# Patient Record
Sex: Female | Born: 1975 | Race: Black or African American | Hispanic: Yes | Marital: Single | Smoking: Never smoker
Health system: Southern US, Community
[De-identification: ages and names within clinical notes are randomized; demographics above are authoritative.]

## PROBLEM LIST (undated history)

## (undated) DIAGNOSIS — F419 Anxiety disorder, unspecified: Secondary | ICD-10-CM

## (undated) DIAGNOSIS — F410 Panic disorder [episodic paroxysmal anxiety] without agoraphobia: Secondary | ICD-10-CM

---

## 2000-06-20 ENCOUNTER — Inpatient Hospital Stay (HOSPITAL_COMMUNITY): Admission: AD | Admit: 2000-06-20 | Discharge: 2000-06-22 | Payer: Self-pay | Admitting: Obstetrics and Gynecology

## 2000-08-01 ENCOUNTER — Other Ambulatory Visit: Admission: RE | Admit: 2000-08-01 | Discharge: 2000-08-01 | Payer: Self-pay | Admitting: Obstetrics and Gynecology

## 2001-07-12 ENCOUNTER — Other Ambulatory Visit: Admission: RE | Admit: 2001-07-12 | Discharge: 2001-07-12 | Payer: Self-pay | Admitting: Obstetrics and Gynecology

## 2001-07-15 ENCOUNTER — Encounter: Payer: Self-pay | Admitting: Obstetrics and Gynecology

## 2001-07-15 ENCOUNTER — Encounter: Admission: RE | Admit: 2001-07-15 | Discharge: 2001-07-15 | Payer: Self-pay | Admitting: Obstetrics and Gynecology

## 2001-09-16 ENCOUNTER — Emergency Department (HOSPITAL_COMMUNITY): Admission: EM | Admit: 2001-09-16 | Discharge: 2001-09-16 | Payer: Self-pay | Admitting: Emergency Medicine

## 2002-04-15 ENCOUNTER — Encounter: Payer: Self-pay | Admitting: Obstetrics and Gynecology

## 2002-04-15 ENCOUNTER — Encounter: Admission: RE | Admit: 2002-04-15 | Discharge: 2002-04-15 | Payer: Self-pay | Admitting: Obstetrics and Gynecology

## 2002-04-22 ENCOUNTER — Other Ambulatory Visit: Admission: RE | Admit: 2002-04-22 | Discharge: 2002-04-22 | Payer: Self-pay | Admitting: Obstetrics and Gynecology

## 2002-10-02 HISTORY — PX: CERVICAL CONE BIOPSY: SUR198

## 2003-04-07 ENCOUNTER — Other Ambulatory Visit: Admission: RE | Admit: 2003-04-07 | Discharge: 2003-04-07 | Payer: Self-pay | Admitting: Obstetrics and Gynecology

## 2003-04-29 ENCOUNTER — Encounter (INDEPENDENT_AMBULATORY_CARE_PROVIDER_SITE_OTHER): Payer: Self-pay | Admitting: Specialist

## 2003-04-29 ENCOUNTER — Ambulatory Visit (HOSPITAL_COMMUNITY): Admission: RE | Admit: 2003-04-29 | Discharge: 2003-04-29 | Payer: Self-pay | Admitting: Obstetrics and Gynecology

## 2003-11-05 ENCOUNTER — Other Ambulatory Visit: Admission: RE | Admit: 2003-11-05 | Discharge: 2003-11-05 | Payer: Self-pay | Admitting: Obstetrics and Gynecology

## 2004-04-16 ENCOUNTER — Inpatient Hospital Stay (HOSPITAL_COMMUNITY): Admission: AD | Admit: 2004-04-16 | Discharge: 2004-04-18 | Payer: Self-pay | Admitting: Obstetrics and Gynecology

## 2004-05-27 ENCOUNTER — Other Ambulatory Visit: Admission: RE | Admit: 2004-05-27 | Discharge: 2004-05-27 | Payer: Self-pay | Admitting: Obstetrics and Gynecology

## 2007-02-07 ENCOUNTER — Inpatient Hospital Stay (HOSPITAL_COMMUNITY): Admission: RE | Admit: 2007-02-07 | Discharge: 2007-02-10 | Payer: Self-pay | Admitting: Obstetrics and Gynecology

## 2007-02-07 ENCOUNTER — Encounter (INDEPENDENT_AMBULATORY_CARE_PROVIDER_SITE_OTHER): Payer: Self-pay | Admitting: Specialist

## 2007-10-26 ENCOUNTER — Emergency Department (HOSPITAL_COMMUNITY): Admission: EM | Admit: 2007-10-26 | Discharge: 2007-10-26 | Payer: Self-pay | Admitting: Family Medicine

## 2008-11-04 ENCOUNTER — Inpatient Hospital Stay (HOSPITAL_COMMUNITY): Admission: AD | Admit: 2008-11-04 | Discharge: 2008-11-06 | Payer: Self-pay | Admitting: Obstetrics and Gynecology

## 2009-02-13 ENCOUNTER — Emergency Department (HOSPITAL_COMMUNITY): Admission: EM | Admit: 2009-02-13 | Discharge: 2009-02-13 | Payer: Self-pay | Admitting: Family Medicine

## 2009-12-14 ENCOUNTER — Emergency Department (HOSPITAL_COMMUNITY)
Admission: EM | Admit: 2009-12-14 | Discharge: 2009-12-14 | Payer: Self-pay | Source: Home / Self Care | Admitting: Family Medicine

## 2010-01-28 ENCOUNTER — Encounter: Admission: RE | Admit: 2010-01-28 | Discharge: 2010-01-28 | Payer: Self-pay | Admitting: Family Medicine

## 2011-01-10 LAB — POCT URINALYSIS DIP (DEVICE)
Bilirubin Urine: NEGATIVE
Glucose, UA: NEGATIVE mg/dL
Nitrite: NEGATIVE
Protein, ur: 30 mg/dL — AB
Specific Gravity, Urine: 1.025 (ref 1.005–1.030)
Urobilinogen, UA: 0.2 mg/dL (ref 0.0–1.0)
pH: 7 (ref 5.0–8.0)

## 2011-01-10 LAB — POCT PREGNANCY, URINE: Preg Test, Ur: NEGATIVE

## 2011-01-17 LAB — CBC
HCT: 30.4 % — ABNORMAL LOW (ref 36.0–46.0)
Hemoglobin: 7.3 g/dL — CL (ref 12.0–15.0)
Hemoglobin: 8.5 g/dL — ABNORMAL LOW (ref 12.0–15.0)
MCHC: 31.6 g/dL (ref 30.0–36.0)
MCHC: 31.7 g/dL (ref 30.0–36.0)
MCHC: 32.3 g/dL (ref 30.0–36.0)
MCV: 79.2 fL (ref 78.0–100.0)
Platelets: 200 10*3/uL (ref 150–400)
Platelets: 228 10*3/uL (ref 150–400)
Platelets: 253 10*3/uL (ref 150–400)
RBC: 2.85 MIL/uL — ABNORMAL LOW (ref 3.87–5.11)
RBC: 4.73 MIL/uL (ref 3.87–5.11)
RDW: 15.8 % — ABNORMAL HIGH (ref 11.5–15.5)
RDW: 15.9 % — ABNORMAL HIGH (ref 11.5–15.5)
WBC: 11.8 10*3/uL — ABNORMAL HIGH (ref 4.0–10.5)
WBC: 16 10*3/uL — ABNORMAL HIGH (ref 4.0–10.5)
WBC: 8.9 10*3/uL (ref 4.0–10.5)

## 2011-01-17 LAB — RPR: RPR Ser Ql: NONREACTIVE

## 2011-01-17 LAB — TYPE AND SCREEN: ABO/RH(D): A POS

## 2011-01-17 LAB — ABO/RH: ABO/RH(D): A POS

## 2011-02-14 NOTE — Discharge Summary (Signed)
NAMEREMMY, CRASS NO.:  [id number]   MEDICAL RECORD NO.:  [id number]          PATIENT TYPE:  INP   LOCATION:  NA                            [facility]  [facility]   PHYSICIAN:  Malachi Pro. Ambrose Mantle, M.D. DATE OF BIRTH:  05/15/1976   DATE OF ADMISSION:  11/04/2008  DATE OF DISCHARGE:                               DISCHARGE SUMMARY   A 35 year old black married female para 3-0-3 gravida 4, EDC, November 04, 2008, admitted with contractions.  The patient has had a previous C-  section.  She was actually scheduled for repeat cesarean on November 05, 2008, but began contracting at home.  Blood group and type was A+,  negative antibody, sickle cell negative, RPR nonreactive, rubella  immune, hepatitis B surface antigen negative, HIV negative, GC and  Chlamydia negative.  TSH 04564, free T4 1.04.  Declined first and second  trimester screens, 1-hour Glucola 138.  Group B strep positive.  Vaginal  ultrasound on April 16, 2008, crown-rump length 4.24 cm, 11 weeks 1 day,  [facility] November 04, 2008.  Ultrasound on June 05, 2008, average  gestational age [redacted] weeks 4 days, [facility] November 02, 2008.  Prenatal care  was uncomplicated.  The patient planned the cesarean as stated on  November 05, 2008 if no labor.  She began contractions and came to the  maternity admission unit for evaluation.  Contractions were every 4-6  minutes.   PAST MEDICAL HISTORY:  Allergies: TO PERCOCET CAUSED ITCHING AND RASH.  Illnesses:  Depression.  Operation:  C-section _Conization of the cervix  _ alcohol, tobacco and drugs none.   FAMILY HISTORY:  Brother with high blood pressure, father depression.  Maternal grandmother with high blood pressure and a CVA.   OBSTETRICAL HISTORY:  September 2001, a 7 pounds 13 ounces female  vaginally at 40 weeks, July 2005, a 7 pounds 5 ounces female at 63 weeks  vaginally, May 2008, an 8 pounds 4 ounces female 39+ weeks C-section for  breech presentation.   PHYSICAL EXAM:   VITAL SIGNS:  Normal.  HEART/LUNGS:  Normal.  ABDOMEN:  Soft.  Fundal height had been 35 cm on November 03, 2008.  Fetal heart tones were normal.  Cervix was 3 cm, 100% vertex at -1.  Artificial rupture of membranes was attempted.  No fluid was seen.  The  patient was started on IV ampicillin to get the ampicillin in quicker in  case her labor was fast.  By 6:50 a.m., the patient had received an  epidural.  Contractions were far apart, so Pitocin had been begun at  6:15 a.m.  At 6:50 a.m., the contractions were every 4 minutes.  Cervix  was 5 cm, 100% vertex at -1 to 0 station.  She progressed to full  dilatation, pushed well and delivered a 7-pound 11-ounce female infant ROA  over a second-degree midline laceration.  Apgars were 9 at one and 9 at  five minutes.  Placenta was intact.  Uterus and prior C-section scars  were intact.  Second-degree midline laceration repaired with 2-0 Vicryl.  Blood loss about  400 mL.  I was called somewhere around 12:30 to 12:40  p.m. because of heavy bleeding.  The patient had been brought to her  room at approximately 10:30 a.m. without IV fluids.  She dozed off and  when she awoke, she had a gush of blood and cramps.  The bleeding and  cramps worsened and I was called at somewhere around 12:30 to 12:40 p.m.  When I arrived, the patient was crying in pain with pain in her right  lower quadrant and right low back.  The bladder was catheterized and I  removed 450 mL of clot and blood from the uterus.  Hemabate and Pitocin  had been started.  Blood pressure was 110/70, pulse was 91.  I massaged  the uterus vigorously and the patient's pain in the right lower  quadrant, right back eased.  Another exam of the uterus produced 2 more  clots that made the total 500 mL, the last 2 clots were felt to be  residual clots rather than active bleeding.  Blood pressure remained  steady at 104/69 and pulse was 90.  The patient looked pink.  We  continued the IV Pitocin and  massaged the uterus and followed her blood  count.  She never became hypotensive.  She ambulated well without  difficulty and on the second postpartum day, she was afebrile.  Her  blood pressure was normal and she was ready for discharge.  She did  undergo a type and screen which showed A+ with a negative antibody, RPR  was nonreactive.  Initial hemoglobin was 11/07, hematocrit 36.9, white  count 8900, platelet count 253,000.  Followup hemoglobins were 9.6, at  the time of the bleeding, 8.5 and 7.3.   FINAL DIAGNOSIS:  Intrauterine pregnancy at 40 weeks, delivered ROA,  prior cesarean section.  Postpartum hemorrhage from uterine atony.   OPERATION:  Spontaneous delivery right occiput anterior, evacuation of  blood clots from the uterus, repair of second-degree midline laceration.   FINAL CONDITION:  Improved.   INSTRUCTIONS:  Our regular discharge instruction booklet.  The patient  is given Darvocet-N 100, 30 tablets 1 every 4-6 hours as needed for pain  and Motrin 600 mg 30 tablets 1 every 6 hours as needed for pain.  She is  advised to take ferrous sulfate 325 mg twice daily and continue her  prenatal vitamins.  She is to return to the office in 6 weeks for  followup examination, report any problems.      Malachi Pro. Ambrose Mantle, M.D.  Electronically Signed     TFH/MEDQ  D:  11/06/2008  T:  11/06/2008  Job:  [id number]

## 2011-02-17 NOTE — Op Note (Signed)
NAMECHRIS, NARASIMHAN NO.:  [id number]   MEDICAL RECORD NO.:  [id number]          PATIENT TYPE:  INP   LOCATION:  9110                          FACILITY:  [facility]   PHYSICIAN:  Malachi Pro. Ambrose Mantle, M.D. DATE OF BIRTH:  09/29/1976   DATE OF PROCEDURE:  02/07/2007  DATE OF DISCHARGE:                               OPERATIVE REPORT   PREOPERATIVE DIAGNOSES:  Intrauterine pregnancy, 39 plus weeks, breech  presentation.   POSTOPERATIVE DIAGNOSES:  Intrauterine pregnancy, 39 plus weeks, breech  presentation.   OPERATION:  Low-transverse cervical C-section.   SURGEON:  Malachi Pro. Ambrose Mantle, M.D.   ASSISTANT:  Sherron Monday, MD   ANESTHESIA:  Spinal anesthesia by Dr. Tacy Dura.   DESCRIPTION OF PROCEDURE:  The patient was brought to the operating room  after breech presentation had been confirmed in the holding area.  She  was given a spinal anesthetic by Dr. Tacy Dura, placed in the left lateral  tilt position.  The abdomen was prepped with Betadine solution.  The  urethra was prepped and a Foley catheter was inserted to straight drain.  The patient was placed flat with tilt to the left.  The abdomen was  draped as a sterile field.  Anesthesia was confirmed by pinching with an  Allis clamp on the lower abdomen.  A transverse incision was made and  carried in layers through the skin and subcutaneous tissue and fascia.  The fascia was separated from the rectus muscles superiorly and  inferiorly.  The rectus muscle was then split in the midline.  The  peritoneum was opened vertically.  The lower uterine segment was  exposed.  Incision was made in the lower uterine segment myometrium.  With my finger I went the rest of the way into the myometrium into the  amniotic sac.  I got clear fluid.  I pulled superiorly and inferiorly on  the incision and then identified both feet.  I pulled both feet out  through the incisional opening and then with a combination of slight  pressure on the  legs and fundal pressure I was able to deliver the baby  up to the buttocks, up through the thorax.  The left arm was delivered  easily as was the right arm.  The face presented.  The baby did not pop  out with fundal pressure so I used the Mauriceau-Smellie-Veit maneuver  and the baby still did not come out so I placed my fingers under the  baby's head and was a able to deliver the head without difficulty.  Cord  was clamped.  The infant was given Dr. Rennis Golden who was in  attendance.  Dr. Alison Murray assigned the 8 pound 4 ounces female infant,  Apgars of 9 at 1 and 9 at 5 minutes.  A small loop of cord was preserved  in case a pH was necessary.  The rest of the placenta and cord were  preserved for the cord blood people.  I inspected the inside of the  uterus, found there to be no placental fragments left.  This cervix was  already a fingertip dilated  and that was confirmed on the exam on  02/06/2007, so no dilatation of the cervix was done.  The uterine  incision was then closed in two layers using running locked suture of 0  Vicryl on the first layer, nonlocking suture of the same material on the  second layer.  A couple of sutures of 3-0 Vicryl were required for  complete hemostasis.  Liberal irrigation confirmed hemostasis.  The  gutters were blotted free of blood.  Both tubes and ovaries and the  uterus appeared normal.  The abdominal wall was then closed using  interrupted sutures of 0 Vicryl including the  rectus muscle and the peritoneum.  Two running sutures of 0 Vicryl on  the fascia, running 3-0 Vicryl on the subcu tissue and staples on the  skin.  The patient seemed to tolerate the procedure well.  Blood loss  was estimated at 1000 mL.  Sponge and needle counts were correct.  She  was returned to recovery in satisfactory condition.      Malachi Pro. Ambrose Mantle, M.D.  Electronically Signed     TFH/MEDQ  D:  02/07/2007  T:  02/07/2007  Job:  [id number]

## 2011-02-17 NOTE — Discharge Summary (Signed)
NAMETOSHIBA, NULL               ACCOUNT NO.:  [id number]   MEDICAL RECORD NO.:  [id number]          PATIENT TYPE:  INP   LOCATION:  9110                          [facility]ACILITY[facility]  [facility]   PHYSICIAN:  Sherron Monday, MD        DATE OF BIRTH:  28-Mar-1976   DATE OF ADMISSION:  02/07/2007  DATE OF DISCHARGE:  02/10/2007                               DISCHARGE SUMMARY   ADMITTING DIAGNOSIS:  Intrauterine pregnancy at term, breech  presentation.   DISCHARGE DIAGNOSIS:  Intrauterine pregnancy at term, breech  presentation, delivered by primary low transverse cesarean section.   HISTORY OF PRESENT ILLNESS:  A 35 year old [place] female, G3  P2-0-0-2, with an EDC of Feb 13, 2007.  She is admitted for a C-section  for breech presentation.  Her prenatal care has been relatively  uncomplicated.  After being counseled regarding external cephalic  version versus expectant management versus primary cesarean section, she  decided to proceed with a cesarean section.   PAST MEDICAL HISTORY:  Depression.   PAST SURGICAL HISTORY:  Conization of her cervix.   PAST OB/GYN HISTORY:  Term spontaneous vaginal delivery x2.  G3 is the  present pregnancy.   ALLERGIES:  PERCOCET, which causes itching and rash.   SOCIAL HISTORY:  Denies alcohol, tobacco, or drug use.   FAMILY HISTORY:  Significant for a brother with high blood pressure and  diabetes.  Mother and maternal grandparents have anemia.  Father and  paternal uncle have depression.  Maternal grandmother with hypertension  and a stroke.  Maternal grandfather with lung cancer.   PHYSICAL EXAMINATION:  VITAL SIGNS:  She is afebrile, vital signs  stable.  GENERAL:  Normal exam.   Again, breech presentation was confirmed by ultrasound.  She was  admitted and underwent the cesarean section without complication, with  an EBL of approximately 1000 cc, and delivered an 8 pound, 4 ounce  female with Apgars of 9 at one minute and 9 at five minutes.   Her  postpartum course was relatively uncomplicated.  She was discharged to  home on postoperative day 3.  At this time, she was tolerating a regular  diet, passing flatus,  ambulating well, normal lochia, and her pain was controlled.  Her  staples were removed, and Steri-Strips were applied.  She was discharged  to home, with routine discharge instructions, including numbers to call  with any questions or problems, as well as prescriptions for Darvocet,  Motrin, and prenatal vitamins.      Sherron Monday, MD  Electronically Signed     JB/MEDQ  D:  02/10/2007  T:  02/11/2007  Job:  [id number]

## 2011-02-17 NOTE — H&P (Signed)
Kristina Gilmore, Kristina Gilmore NO.:  [id number]   MEDICAL RECORD NO.:  [id number]          PATIENT TYPE:  INP   LOCATION:  NA                            [facility]   PHYSICIAN:  Malachi Pro. Ambrose Mantle, M.D. DATE OF BIRTH:  04-25-1976   DATE OF ADMISSION:  02/07/2007  DATE OF DISCHARGE:                              HISTORY & PHYSICAL   HISTORY OF PRESENT ILLNESS:  This is a 35 year old black female para 2,  0, 0, 2, gravida 3, EDC Feb 13, 2007, who is admitted for C-section for  breech presentation.  Blood group and type:  A positive, negative antibody, sickle cell has  been done in the past, was negative. RPR nonreactive, rubella immune,  hepatitis B surface antigen negative, HIV negative, GC and Chlamydia  negative. Group B strep positive.  This patient had a vaginal ultrasound  on July 12, 2006, crown-rump length 2.38 cm, 9 weeks, 1 day. [facility] Feb 13, 2007.  On hour Glucola 181, 3 hour GTT 82, 151, 152 and 93.  The  patient had a relatively uncomplicated prenatal course.  On January 23, 2007 she was noted to have breech presentation confirmed by ultrasound;  reconfirmed on Jan 31, 2007 and Feb 06, 2007.  She has been informed that  she could schedule a C-section, await labor, see if the baby turned or  have external cephalic version. When the breech presentation was  confirmed for a third time, she chose to proceed with cesarean section.   PAST MEDICAL HISTORY:   ALLERGIES:  PERCOCET (caused itching and rash).   ILLNESSES:  The patient has had depression within the last year. She  reacted with a depression to stress after her brother's death. She was  placed on Lexapro. A cousin has sickle cell anemia.   OPERATIONS:  In 2004:  Conization of the cervix, moderate dysplasia.   ALCOHOL/TOBACCO/DRUGS:  None.   FAMILY HISTORY:  Brother with high blood pressure, possible diabetes.  Mother and grandparents have anemia. Father and paternal uncle have had  depression.  Maternal grandmother with hypertension and CVA. Maternal  grandfather with lung cancer.   PHYSICAL EXAMINATION:  Height 5 feet, 6 inches, 183 pound black female  in no distress.  VITAL SIGNS:  Blood pressure 114/64, pulse 80.  HEENT:  Normal head, eyes, ears, nose and throat.  LUNGS:  Clear to auscultation.  HEART:  Normal size and sounds, no murmurs.  ABDOMEN:  Soft, fundal height 38 cm, fetal heart tones normal.  PELVIC:  Cervix finger tip, 80%, breech presentation confirmed by  ultrasound.   OBSTETRIC HISTORY:  The patient has had two prior deliveries in  September of 2001 and July of 2005, both female infants, vaginally under  epidural anesthesia without complications.   ADMITTING IMPRESSION:  Intrauterine pregnancy 39 weeks, breech  presentation. Patient is admitted for cesarean section after declining  external cephalic version and declining the opportunity to await labor.      Malachi Pro. Ambrose Mantle, M.D.  Electronically Signed     TFH/MEDQ  D:  02/06/2007  T:  02/06/2007  Job:  [id number] 

## 2011-02-17 NOTE — H&P (Signed)
NAME:  Kristina Gilmore, Kristina Gilmore                         ACCOUNT NO.:  [id number]   MEDICAL RECORD NO.:  [id number]                   PATIENT TYPE:  AMB   LOCATION:  DAY                                  FACILITY:  [facility]   PHYSICIAN:  Zenaida Niece, M.D.             DATE OF BIRTH:  May 26, 1976   DATE OF ADMISSION:  04/29/2003  DATE OF DISCHARGE:                                HISTORY & PHYSICAL   CHIEF COMPLAINT:  High-grade Pap smear.   HISTORY OF PRESENT ILLNESS:  This is a 35 year old female, gravida 1, para  0, whom I saw for an annual exam on April 07, 2003.  She had no complaints at  that time and was thinking about attempting to get pregnant.  Pap smear at  that time returned with a high-grade SIL.  Colposcopy performed on April 20, 2003, revealed no significant acetowhite lesions.  The ECC returned with  minuet fragments of atypical endocervical mucosa which favors a reactive  change.  I thus have no explanation for this high-grade Pap smear.  The  patient does have a history of abnormal Pap smears.  In October of 2001, she  had benign reactive changes.  In October of 2002, she had a low-grade  lesion.  Colposcopy performed then was normal and an ECC that time was  completely benign.  She had a followup Pap smear in July of 2003 which was  benign reactive changes and this is her most recent Pap smear since then.  Options have been discussed with the patient and she is admitted for cone  biopsy.   OBSTETRICAL HISTORY:  One vaginal delivery, which was a forceps delivery, at  term without complications.   PAST MEDICAL HISTORY:  Fractured left arm.   PAST SURGICAL HISTORY:  Wisdom teeth removal.   ALLERGIES:  None known.   CURRENT MEDICATIONS:  Lucienne Minks.   SOCIAL HISTORY:  The patient is recently married and denies alcohol,  tobacco, or drug use.   REVIEW OF SYSTEMS:  Negative.   FAMILY HISTORY:  No GYN or colon cancer.   PHYSICAL EXAMINATION:  WEIGHT:  147 pounds.  VITAL  SIGNS:  Stable.  GENERAL APPEARANCE:  She is a well-developed female in no acute distress.  NECK:  Supple without lymphadenopathy or thyromegaly.  LUNGS:  Clear to auscultation.  HEART:  Regular rate and rhythm without murmur.  ABDOMEN:  Soft, nontender, and nondistended without palpable masses.  No  hepatosplenomegaly.  BREASTS:  Breasts examined in the sitting and supine positions reveal no  dominant masses, adenopathy, skin change, or nipple discharge.  PELVIC:  The external genitalia have no lesions.  The speculum exam reveals  a normal cervix.  The bimanual exam reveals a small anteverted nontender  uterus.  There are no adnexal masses.   ASSESSMENT:  High-grade squamous intraepithelial lesion Pap smear with  benign endocervical curettage and normal colposcopy.  This is a  significant  discrepancy between Pap smear and colposcopy.  A diagnostic procedure is  indicated.  The risks of cone biopsy have been discussed with the patient,  including bleeding, infection, and damage to bowel and bladder.   PLAN:  Admit the patient for a cold knife cone biopsy on April 29, 2003.                                               Zenaida Niece, M.D.    TDM/MEDQ  D:  04/28/2003  T:  04/28/2003  Job:  [id number]

## 2011-02-17 NOTE — Op Note (Signed)
NAME:  Kristina Gilmore, Kristina Gilmore                         ACCOUNT NO.:  [id number]   MEDICAL RECORD NO.:  [id number]                   PATIENT TYPE:  AMB   LOCATION:  DAY                                  FACILITY:  [facility]   PHYSICIAN:  Zenaida Niece, M.D.             DATE OF BIRTH:  [birth year]   DATE OF PROCEDURE:  04/29/2003  DATE OF DISCHARGE:                                 OPERATIVE REPORT   PREOPERATIVE DIAGNOSIS:  High-grade squamous intraepithelial lesion Pap  smear.   POSTOPERATIVE DIAGNOSIS:  High-grade squamous intraepithelial lesion Pap  smear.   PROCEDURE:  Cold knife cone biopsy.   SURGEON:  Zenaida Niece, M.D.   ANESTHESIA:  General with an LMA and paracervical block.   ESTIMATED BLOOD LOSS:  50 mL.   FINDINGS:  Preop, the patient has a high-grade SIL Pap smear with a normal  colposcopy with an ECC with minute fragments of atypical endocervical  epithelium that favors a reactive process.  In the operating room here,  there are no significant nonstaining areas with Lugol solution.   PROCEDURE IN DETAIL:  The patient was taken to the operating room and placed  in the dorsal supine position.  General anesthesia was induced, and she was  placed in mobile stirrups.  Perineum and vagina was prepped and draped in  the usual sterile fashion and her bladder drained with a red rubber  catheter.  A Graves speculum was inserted into the vagina and the anterior  lip of the cervix grasped with a single-tooth tenaculum.  Paracervical block  was then performed with 10 mL of 2% lidocaine.  The cervix was then dipped  in Lugol solution, and there was noted to be no nonstaining areas.  Single-  tooth tenaculum was removed and bleeding controlled with pressure and  electrocautery.  Stay sutures were then placed with #1 chromic at 3 and 9  o'clock.  A long knife was then used to start the cone specimen in a  circumferential manner.  The anterior and posterior lips of the cone were  then grasped with an Allis, and the remainder of the cone was removed  sharply with Mayo scissors.  The cone specimen was removed intact.  Initially, the cone bed was coagulated with bipolar cautery and packed with  Surgicel.  The stay sutures were tied across the cervix.  Inspection  revealed continued steady bleeding.  The stay sutures were cut, and the  Surgicel was removed.  The largest source of bleeding was seen to come from  3 o'clock.  This was unable to be controlled with electrocautery.  A figure-  of-eight suture with 3-0 Vicryl was placed, and this controlled most of the  bleeding.  The bed was again coagulated with the ball electrode to achieve  hemostasis.  The bed was then again packed with Surgicel and appeared to be  hemostatic.  All instruments were then removed from  the vagina.  The patient  was awakened in the operating room, tolerated the procedure well, and was  taken to the recovery room in stable condition.  Counts were correct, and  she was given no antibiotics.                                               Zenaida Niece, M.D.    TDM/MEDQ  D:  04/29/2003  T:  04/29/2003  Job:  [id number]

## 2011-02-17 NOTE — Discharge Summary (Signed)
[facility]  Patient:    Kristina Gilmore, Kristina Gilmore                     MRN: [id number] Adm. Date:  [id number] Disc. Date: [id number] Attending:  Michaele Offer                           Discharge Summary  HISTORY OF PRESENT ILLNESS:   This 35 year old black female, para 0, gravida 1, estimated gestational age [redacted] weeks by 13-14 week ultrasound with EDC of June 19, 2000, presented complaining of contractions.  No vaginal bleeding or ruptured membranes.  Good fetal movement.  The initial exam at 2 a.m. by the registered nurse revealed the cervix to be 3 cm, 70%, and vertex at a -2.  Contractions were irregular, but the cervix changed to 5 cm by 8:25 a.m.  Blood group and type were A+ with a negative antibody.  Rubella was immune.  Hepatitis B surface antigen negative.  RPR was nonreactive.  Sickle cell negative.  Pap smear normal in March of 2001.  GC and chlamydia negative. One-hour glucola 102.  Group B streptococcus was positive.  The prenatal course was essentially uncomplicated.  She transferred to our practice at approximately [redacted] weeks gestation.  PAST MEDICAL HISTORY:         A fracture of the left arm in the past.  PAST SURGICAL HISTORY:        Wisdom teeth removal.  ALLERGIES:                    No known drug allergies.  MEDICATIONS:                  None.  SOCIAL HISTORY:               Single.  The father of the baby is here and supportive.  No tobacco.  PHYSICAL EXAMINATION:         The physical exam on admission revealed normal vital signs.  ABDOMEN:                      Gravid and nontender with an estimated fetal weight by Zenaida Niece, M.D., of 7-1/2 pounds.  Fetal heart tones were reactive.  Contractions were every two to four minutes.  PELVIC:                       The cervix was 5 cm, completely effaced, and vertex at a 0 station with what was felt to be an adequate pelvis.  Membranes were intact.  HOSPITAL COURSE:               The patient received an epidural.  She developed late decelerations on Pitocin, so the Pitocin was discontinued.  By 6:25 p.m., the cervix was 6-7 cm.  Artificial rupture of membranes produced clear fluid.  The cervix was 6 cm, 100%, and vertex at a -2.  The patient reached full dilatation at 9:52 p.m.  At that time, the vertex was at about a +1 station.  The patient then progressed to showing about a silver dollar worth of caput at 11:35 p.m., but that was with and without pushing.  She made no significant progress with pushing.  She desired to proceed with forceps delivery.  We asked for an epidural boost.  She was given an epidural boost by Ronaldo Miyamoto  Jean Rosenthal, M.D., and then we proceeded to a low forceps delivery done over a left vaginal, perineal, and bilateral labial lacerations of a living female infant, 7 pounds 13 ounces, with Apgars of 8 and 9 at one and five minutes.  There was one loop of nuchal cord.  The placenta was removed intact. The uterus was normal.  The rectum was negative.  Left vagina and perineal and right labial lacerations were repaired with 3-0 Dexon.  Left labial laceration was hemostatic and not repaired.  Blood loss about 500 cc.  The baby began to have tachypnea and dusky spells, so was transferred to the NICU after being placed in the regular nursery and remained there, although the baby is no longer on oxygen, but is receiving antibiotics for possible pneumonia.  RPR was nonreactive.  Hemoglobin on admission 12.8, hematocrit 37.5, white count 17,900, and platelet count 229,000.  Follow-up hemoglobin 10.4, hematocrit 31.0, white count 23,600, and platelet count 214,000.  FINAL DIAGNOSIS:              Intrauterine pregnancy at 40 weeks, delivered occipitoanterior.  OPERATION:                    1. Low forceps delivery, occipitoanterior.                               2. Repair of left vaginal and perineal and right                                  labial  lacerations.  FINAL CONDITION:              Improved.  DISCHARGE INSTRUCTIONS:       Instructions include our regular discharge instruction booklet.  She is advised to return to the office in six weeks.  DISCHARGE MEDICATIONS:        1. Tylenol No. 3, 16 tablets, one every four to                                  six hours as needed for pain.  One refill was                                  given.                               2. Motrin 800 mg, 20 tablets, one every eight                                  hours as needed for pain.  One refill was                                  given. DD:  06/22/00 TD:  06/23/00 Job: [id number]

## 2011-02-17 NOTE — Discharge Summary (Signed)
NAME:  Kristina Gilmore, Kristina Gilmore                         ACCOUNT NO.:  [id number]   MEDICAL RECORD NO.:  [id number]                   PATIENT TYPE:  INP   LOCATION:  9138                                 FACILITY:  [facility]   PHYSICIAN:  Malachi Pro. Ambrose Mantle, M.D.              DATE OF BIRTH:  1976-07-04   DATE OF ADMISSION:  04/16/2004  DATE OF DISCHARGE:                                 DISCHARGE SUMMARY   HOSPITAL COURSE:  A 35 year old black married female para 1-0-0-1 gravida 2;  last period July 08, 2003;  [facility] April 23, 2004 by ultrasound; admitted with  ruptured membranes and contractions.  Blood group and type A positive with a  negative antibody, sickle cell negative, RPR nonreactive, rubella immune,  hepatitis B surface antigen negative, HIV declined, GC and chlamydia  negative, triple screen normal, group B strep positive.  One-hour Glucola  143; 3-hour GTT 82, 113, 136, and 115.  Vaginal ultrasound October 23, 2003:  13 weeks 6 days; [facility] April 23, 2004.  Ultrasound on December 02, 2003:  Average  gestational age [redacted] weeks 1 days; [facility] April 25, 2004.  Prenatal course  essentially uncomplicated.  The patient began leaking fluid on April 16, 2004.  Her contractions became more intense and she came to maternity  admission for evaluation.  Past medical history:  No known allergies.  Operations:  In 2004, cone biopsy of the cervix, CIN-2.  Illnesses:  Anemia.  Alcohol, tobacco, and drugs:  None.  Family history:  Mother with migraines,  father with depression, brother with high blood pressure.  Obstetric  history:  In September 2001 a 7-pound 13-ounce female vaginally.  Physical  exam on admission revealed normal vital signs.  Head, eyes, ears, nose,  throat, heart, and lungs were normal.  The abdomen was soft, fundal height  36 cm on April 13, 2004.  The cervix was somewhat scarred, 4 cm, 90%, vertex  at a -1 station.  There was questionable meconium-stained fluid.  The  patient was placed on penicillin for  the positive group B strep, placed on  Pitocin, and she received an epidural.  By 6:55 a.m. the Pitocin was at 6 mU  a minute, contractions were every 3-4 minutes; cervix was still 4 cm, 90%,  vertex at a -1 to -2.  By 9:10 a.m. the Pitocin was at 12 mU a minute,  contractions every 2-3 minutes; cervix 7 cm, 100%, vertex at a -1.  At 9:40  a.m. the cervix was 8+ cm, 100%, vertex at a -1.  The patient progressed to  full dilatation and pushed well.  She delivered spontaneously OA over a  second degree midline laceration by Dr. Ambrose Mantle a living female infant, 7  pounds 5 ounces, Apgars of 9 at one and 9 at five minutes.  DeLee was done  with the head on the perineum.  It was not clear if the fluid was  meconium  stained.  Rectal was negative, uterus normal.  Second degree midline  laceration repaired with 3-0 Vicryl, blood loss 400 mL.  There was some  redundant perineal tissue but the patient was not aware of it so it was not  removed.  Postpartum the patient did well and was discharged on postpartum  day #2 .  RPR was nonreactive.  Initial hemoglobin 12.8; hematocrit 38.8;  white count 14,200; platelet count 207,000.  Follow-up hemoglobin 11.0,  hematocrit 33.9.   FINAL DIAGNOSIS:  Intrauterine pregnancy at 39 weeks delivered occiput  anterior.   OPERATION:  1. Spontaneous delivery OA.  2. Repair of second degree midline laceration.   FINAL CONDITION:  Improved.   Instructions include our regular discharge instruction booklet.  The patient  is given a prescription for Percocet 5/325 #20 tablets one q.4-6h. as needed  for pain and is asked to return to the office in 6 weeks for follow-up  examination.                                              Malachi Pro. Ambrose Mantle, M.D.   TFH/MEDQ  D:  04/18/2004  T:  04/18/2004  Job:  [id number]

## 2012-11-08 ENCOUNTER — Encounter (HOSPITAL_COMMUNITY): Payer: Self-pay

## 2012-11-08 ENCOUNTER — Emergency Department (HOSPITAL_COMMUNITY)
Admission: EM | Admit: 2012-11-08 | Discharge: 2012-11-08 | Disposition: A | Discharge status: Home / Self Care | Payer: BC Managed Care – PPO | Attending: Emergency Medicine | Admitting: Emergency Medicine

## 2012-11-08 ENCOUNTER — Emergency Department (HOSPITAL_COMMUNITY): Payer: BC Managed Care – PPO

## 2012-11-08 DIAGNOSIS — R0789 Other chest pain: Secondary | ICD-10-CM | POA: Insufficient documentation

## 2012-11-08 DIAGNOSIS — J029 Acute pharyngitis, unspecified: Secondary | ICD-10-CM | POA: Insufficient documentation

## 2012-11-08 DIAGNOSIS — R0602 Shortness of breath: Secondary | ICD-10-CM | POA: Insufficient documentation

## 2012-11-08 DIAGNOSIS — R599 Enlarged lymph nodes, unspecified: Secondary | ICD-10-CM | POA: Insufficient documentation

## 2012-11-08 LAB — CBC WITH DIFFERENTIAL/PLATELET
Eosinophils Absolute: 0.4 10*3/uL (ref 0.0–0.7)
Eosinophils Relative: 4 % (ref 0–5)
HCT: 44.8 % (ref 36.0–46.0)
Hemoglobin: 15.2 g/dL — ABNORMAL HIGH (ref 12.0–15.0)
Lymphocytes Relative: 22 % (ref 12–46)
Lymphs Abs: 2 10*3/uL (ref 0.7–4.0)
MCH: 27.5 pg (ref 26.0–34.0)
MCV: 81 fL (ref 78.0–100.0)
Monocytes Relative: 4 % (ref 3–12)
Platelets: 300 10*3/uL (ref 150–400)
RBC: 5.53 MIL/uL — ABNORMAL HIGH (ref 3.87–5.11)
WBC: 8.8 10*3/uL (ref 4.0–10.5)

## 2012-11-08 LAB — BASIC METABOLIC PANEL
BUN: 9 mg/dL (ref 6–23)
CO2: 26 mEq/L (ref 19–32)
Calcium: 9.9 mg/dL (ref 8.4–10.5)
GFR calc non Af Amer: >90 mL/min (ref >90)
Glucose, Bld: 128 mg/dL — ABNORMAL HIGH (ref 70–99)
Sodium: 138 mEq/L (ref 135–145)

## 2012-11-08 LAB — POCT I-STAT TROPONIN I: Troponin i, poc: 0 ng/mL (ref 0.00–0.08)

## 2012-11-08 MED ORDER — IBUPROFEN 600 MG PO TABS: 600.0000 mg | ORAL_TABLET | Freq: Four times a day (QID) | ORAL | Status: DC | PRN

## 2012-11-08 MED ORDER — IBUPROFEN 800 MG PO TABS
800.0000 mg | ORAL_TABLET | Freq: Once | ORAL | Status: AC
  Administered 2012-11-08: 800 mg via ORAL
  Filled 2012-11-08: qty 1

## 2012-11-08 NOTE — ED Notes (Signed)
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  Pt reports pain has been constant, reports pain to L upper arm.  +shortness of breath - pt reports having flu x 2 weeks ago that has resolved.  Pt denies any long distance travel, is not on birth control pills - IUD.

## 2012-11-08 NOTE — ED Provider Notes (Signed)
History     CSN: [id number]  Arrival date & time 11/08/12  1435   First MD Initiated Contact with Patient 11/08/12 1538      Chief Complaint  Patient presents with  . Chest Pain    (Consider location/radiation/quality/duration/timing/severity/associated sxs/prior treatment) Patient is a 37 y.o. female presenting with chest pain. The history is provided by the patient.  Chest Pain The chest pain began 3 - 5 hours ago. Chest pain occurs intermittently. The chest pain is unchanged. The pain is associated with breathing. The severity of the pain is mild. The quality of the pain is described as heavy. The pain does not radiate. Chest pain is worsened by certain positions. Primary symptoms include shortness of breath. Pertinent negatives for primary symptoms include no fever, no fatigue, no cough, no wheezing, no palpitations, no abdominal pain, no nausea and no vomiting.  Pertinent negatives for associated symptoms include no weakness. She tried nothing for the symptoms. There are no known risk factors.  Pertinent negatives for past medical history include no CAD and no PE.  Pertinent negatives for family medical history include: no CAD in family, no hyperlipidemia in family and no hypertension in family.     History reviewed. No pertinent past medical history.  Past Surgical History  Procedure Laterality Date  . Cesarean section      No family history on file.  History  Substance Use Topics  . Smoking status: Not on file  . Smokeless tobacco: Not on file  . Alcohol Use:     OB History   Grav Para Term Preterm Abortions TAB SAB Ect Mult Living                  Review of Systems  Constitutional: Negative for fever and fatigue.  HENT: Positive for sore throat. Negative for congestion, rhinorrhea and postnasal drip.   Eyes: Negative for photophobia and visual disturbance.  Respiratory: Positive for shortness of breath. Negative for cough, chest tightness and wheezing.    Cardiovascular: Positive for chest pain. Negative for palpitations and leg swelling.  Gastrointestinal: Negative for nausea, vomiting, abdominal pain and diarrhea.  Genitourinary: Negative for urgency, frequency and difficulty urinating.  Musculoskeletal: Negative for back pain and arthralgias.  Skin: Negative for rash and wound.  Neurological: Negative for weakness and headaches.  Psychiatric/Behavioral: Negative for confusion and agitation.    Allergies  Review of patient's allergies indicates no known allergies.  Home Medications   Current Outpatient Rx  Name  Route  Sig  Dispense  Refill  . ibuprofen (ADVIL,MOTRIN) 600 MG tablet   Oral   Take 1 tablet (600 mg total) by mouth every 6 (six) hours as needed for pain.   30 tablet   0     BP 117/77  Pulse 81  Temp(Src) 98.8 F (37.1 C) (Oral)  Resp 18  SpO2 100%  LMP 10/23/2012  Physical Exam  Nursing note and vitals reviewed. Constitutional: She is oriented to person, place, and time. She appears well-developed and well-nourished. No distress.  HENT:  Head: Normocephalic and atraumatic.  Mouth/Throat: Oropharynx is clear and moist.  Eyes: EOM are normal. Pupils are equal, round, and reactive to light.  Neck: Normal range of motion. Neck supple. No JVD present.       Mild anterior cervical lymphadenopathy and submandibular gland  Cardiovascular: Normal rate, regular rhythm, normal heart sounds and intact distal pulses.   Pulmonary/Chest: Effort normal and breath sounds normal. No stridor. She has no wheezes. She  has no rales. She exhibits tenderness.       Moderate tenderness to anterior chest wall  Abdominal: Soft. Bowel sounds are normal. She exhibits no distension. There is no tenderness. There is no rebound and no guarding.  Musculoskeletal: Normal range of motion. She exhibits no edema and no tenderness.       No unilateral leg swelling or redness.  Lymphadenopathy:    She has no cervical adenopathy.   Neurological: She is alert and oriented to person, place, and time. She displays normal reflexes. No cranial nerve deficit. She exhibits normal muscle tone. Coordination normal.  Skin: Skin is warm and dry. No rash noted.  Psychiatric: She has a normal mood and affect. Her behavior is normal.    ED Course  Procedures (including critical care time)   Date: 11/08/2012  Rate: 74  Rhythm: normal sinus rhythm  QRS Axis: normal  Intervals: normal  ST/T Wave abnormalities: normal  Conduction Disutrbances:none  Narrative Interpretation:   Old EKG Reviewed: none available    Labs Reviewed  CBC WITH DIFFERENTIAL - Abnormal; Notable for the following:    RBC 5.53 (*)    Hemoglobin 15.2 (*)    All other components within normal limits  BASIC METABOLIC PANEL - Abnormal; Notable for the following:    Glucose, Bld 128 (*)    All other components within normal limits  D-DIMER, QUANTITATIVE  POCT I-STAT TROPONIN I   Dg Chest 2 View  11/08/2012  *RADIOLOGY REPORT*  Clinical Data: Chest pain.  CHEST - 2 VIEW  Comparison: None.  Findings: Cardiomediastinal silhouette appears normal.  No acute pulmonary disease is noted.  Bony thorax is intact.  IMPRESSION: No acute cardiopulmonary abnormality seen.   Original Report Authenticated By: Lupita Raider.,  M.D.      1. Chest pain, musculoskeletal       MDM  81F with chest pain for the last several hours with some mild associated shortness of breath. She has a sore throat since last month and was treated with antibiotics. Today, the sore throat started again and then the chest pain later today. She spoke with an on-call nurse and told her to come to ED. PERC negative although ddimer was obtained in triage which is negative. EKG without ischemic changes. Exam with tenderness to anterior chest. CXR without acute findings. Likely musculoskeletal. Doubt ACS, PE, dissection, pericarditis, or pneumonia. Given motrin in ED and deemed stable for d/c home.  Return precautions given.        Johnnette Gourd, MD 11/09/12 [id number]

## 2012-11-11 NOTE — ED Provider Notes (Signed)
I have supervised the resident on the management of this patient and agree with the note above. I personally interviewed and examined the patient and my addendum is below.   Kristina Gilmore is a 37 y.o. female here with SOB. Sore throat a month ago that was treated with abx. She had sore throat then mild chest congestion. Was told to come to ED for eval. D-dimer neg, EKG unremarkable, CXR nl. Likely MSK. D/c home. Recommend over the counter medicine for congestion.    Richardean Canal, MD 11/11/12 1536

## 2014-03-11 IMAGING — CR DG CHEST 2V
2 series · 2 of 2 positions shown · non-contrast
Comparison: None.

CLINICAL DATA: Chest pain.

CHEST - 2 VIEW

[w chest pa]
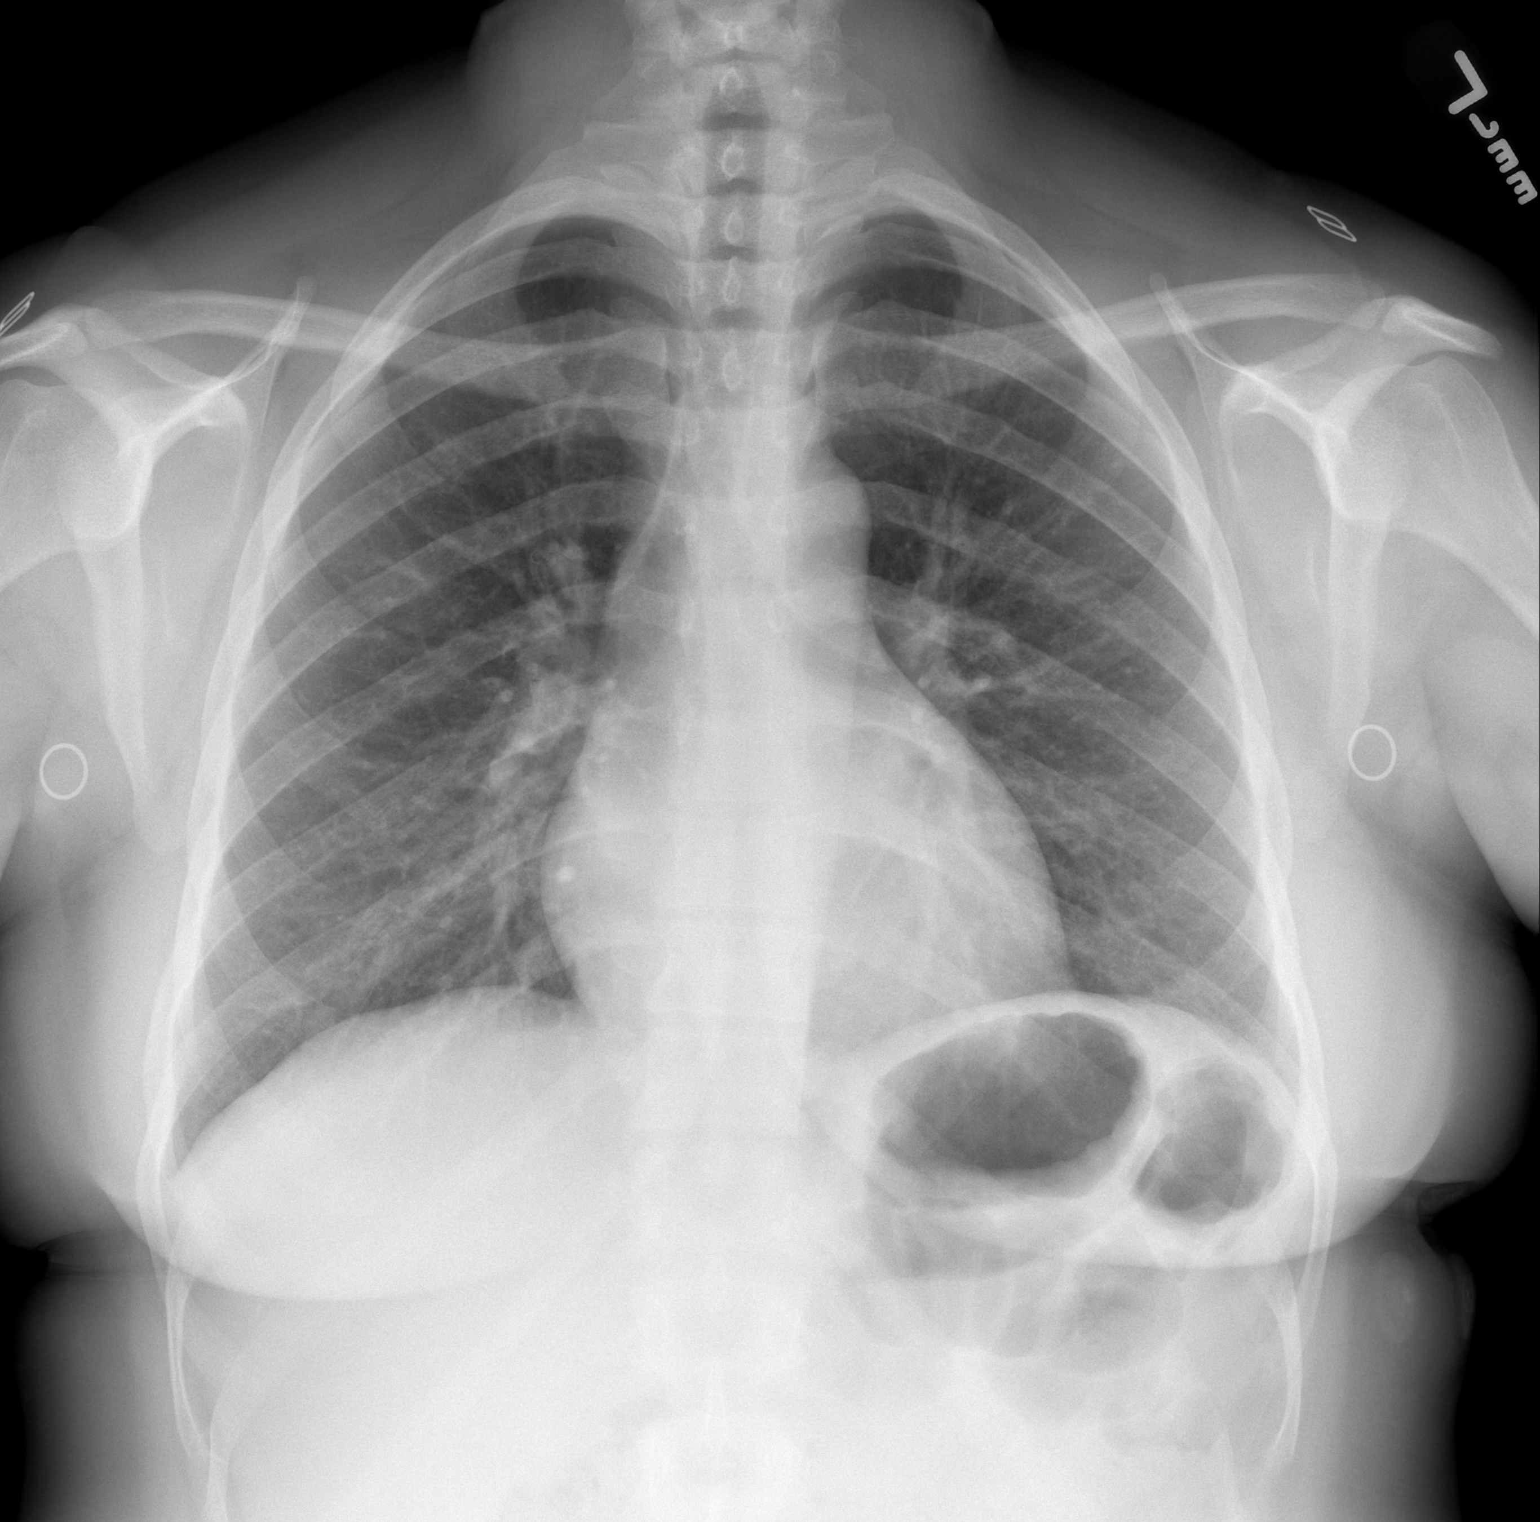

[w chest lat]
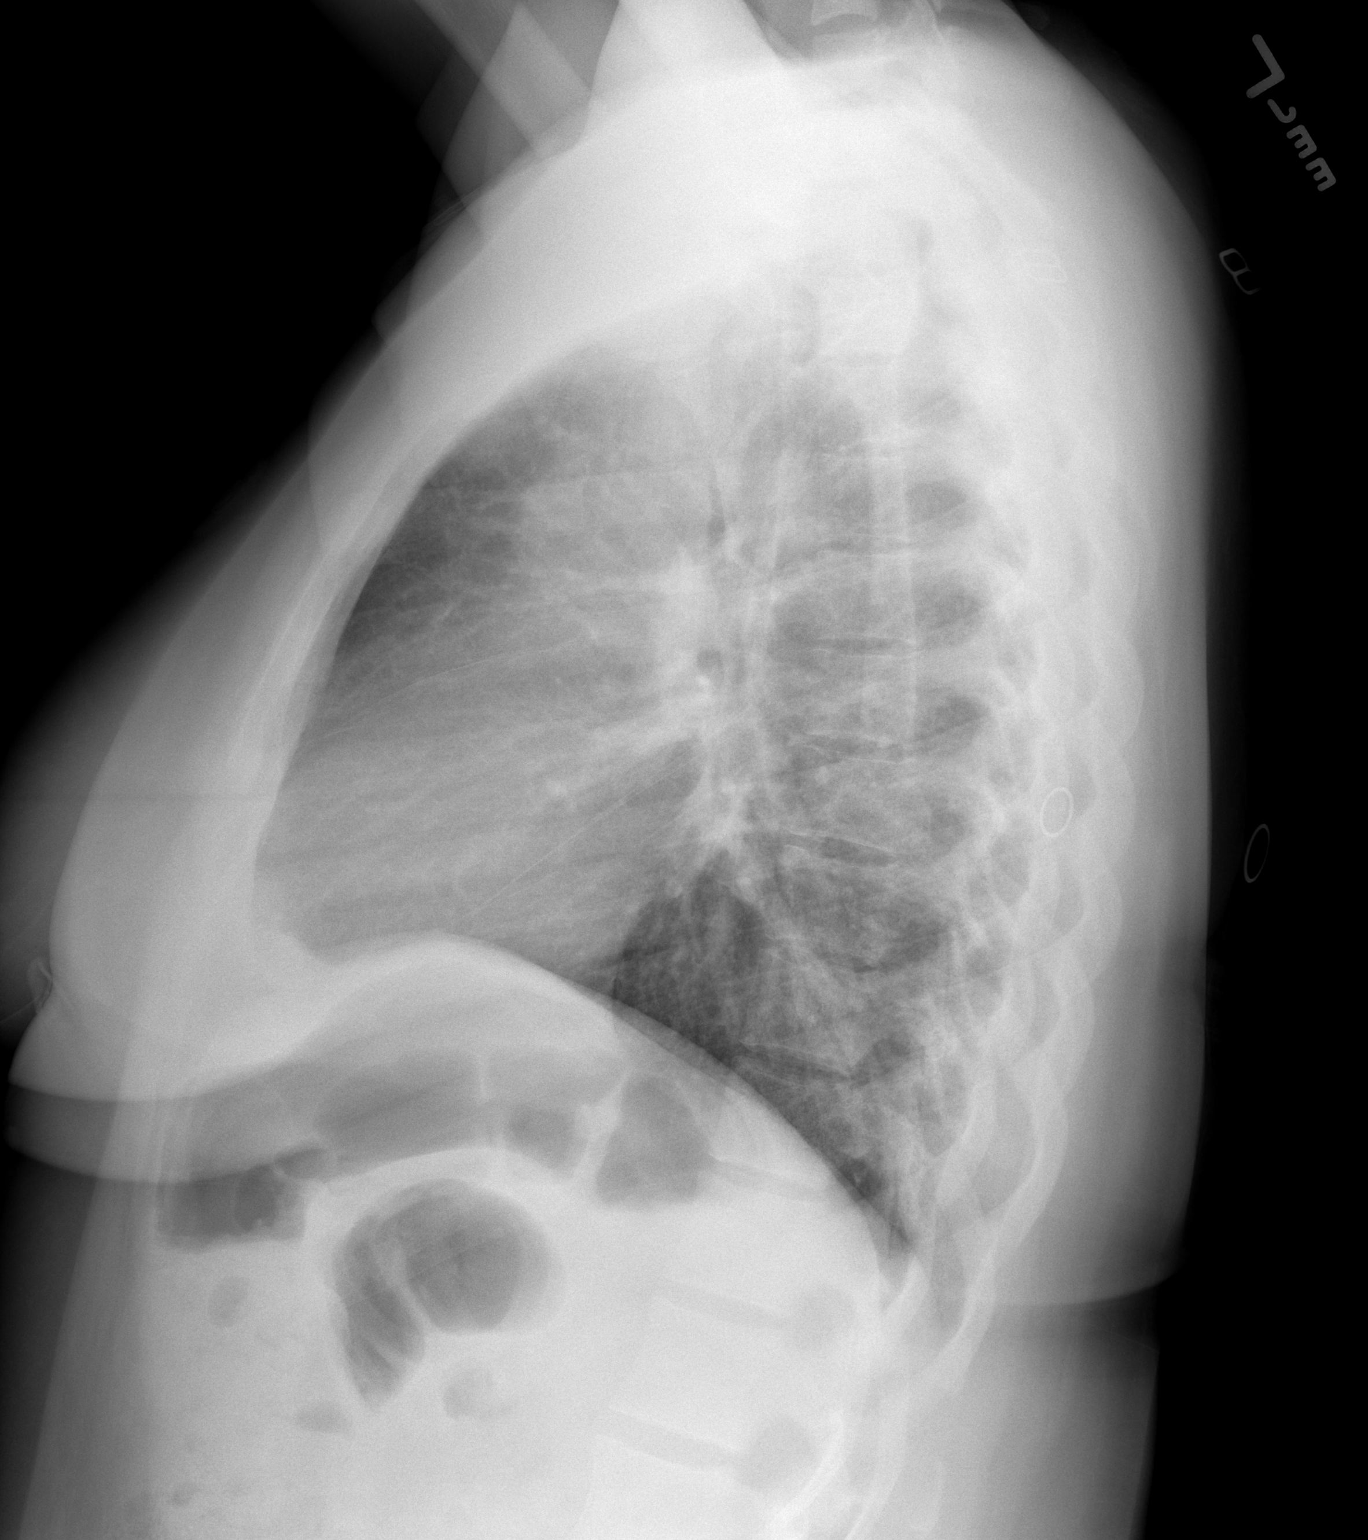

[2 of 2 positions shown; findings below may reference images not displayed]

FINDINGS: Cardiomediastinal silhouette appears normal.  No acute
pulmonary disease is noted.  Bony thorax is intact.
IMPRESSION: No acute cardiopulmonary abnormality seen.

## 2014-08-18 ENCOUNTER — Emergency Department (INDEPENDENT_AMBULATORY_CARE_PROVIDER_SITE_OTHER)
Admission: EM | Admit: 2014-08-18 | Discharge: 2014-08-18 | Disposition: A | Discharge status: Home / Self Care | Payer: Worker's Compensation | Source: Home / Self Care | Attending: Emergency Medicine | Admitting: Emergency Medicine

## 2014-08-18 ENCOUNTER — Encounter (HOSPITAL_COMMUNITY): Payer: Self-pay | Admitting: *Deleted

## 2014-08-18 DIAGNOSIS — S83241A Other tear of medial meniscus, current injury, right knee, initial encounter: Secondary | ICD-10-CM

## 2014-08-18 MED ORDER — METHYLPREDNISOLONE ACETATE 40 MG/ML IJ SUSP
INTRAMUSCULAR | Status: AC
Start: 2014-08-18 — End: 2014-08-18
  Filled 2014-08-18: qty 5

## 2014-08-18 MED ORDER — IBUPROFEN 800 MG PO TABS: 800.0000 mg | ORAL_TABLET | Freq: Three times a day (TID) | ORAL | Status: DC | PRN

## 2014-08-18 MED ORDER — LIDOCAINE HCL (PF) 2 % IJ SOLN
INTRAMUSCULAR | Status: AC
  Filled 2014-08-18: qty 2

## 2014-08-18 NOTE — Discharge Instructions (Signed)
You likely have a small meniscal tear. I injected your knee with a steroid. Ice the knee tonight and then 3 times a day. Take ibuprofen 800mg  3 times a day for the next 3-4 days. You should see improvement in your knee by Thursday or Friday.  If you are not significantly better by next week, make an appointment to see the orthopedic doctor.

## 2014-08-18 NOTE — ED Provider Notes (Signed)
CSN: [id number]     Arrival date & time 08/18/14  1709 History   First MD Initiated Contact with Patient 08/18/14 1834     Chief Complaint  Patient presents with  . Fall   (Consider location/radiation/quality/duration/timing/severity/associated sxs/prior Treatment) HPI She is a 38 year old woman here for evaluation of left knee pain. She states she slipped and fell on her left knee 9 days ago. She had immediate pain and some mild swelling on the medial aspect of the knee. 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 She has been doing this intermittently for the last week. She's also been taking intermittent ibuprofen for the last week. The pain will get better at times. On Sunday she was kneeling with her child when she had sharp pains in her left knee. She denies any locking, popping, clicking.  History reviewed. No pertinent past medical history. Past Surgical History  Procedure Laterality Date  . Cesarean section  2008  . Cervical cone biopsy  2004   History reviewed. No pertinent family history. History  Substance Use Topics  . Smoking status: Never Smoker   . Smokeless tobacco: Not on file  . Alcohol Use: Yes     Comment: occasional   OB History    No data available     Review of Systems Left knee pain Allergies  Darvon and Percocet  Home Medications   Prior to Admission medications   Medication Sig Start Date End Date Taking? Authorizing Provider  ibuprofen (ADVIL,MOTRIN) 800 MG tablet Take 1 tablet (800 mg total) by mouth every 8 (eight) hours as needed. 08/18/14   Charm RingsErin J Faryal Marxen, MD   BP 130/88 mmHg  Pulse 72  Temp(Src) 98.2 F (36.8 C) (Oral)  Resp 16  SpO2 99%  LMP 08/07/2014 Physical Exam  Constitutional: She is oriented to person, place, and time. She appears well-developed and well-nourished. No distress.  Cardiovascular: Normal rate.   Pulmonary/Chest: Effort normal.  Musculoskeletal:  Left knee: No erythema.  Dime sized  healing abrasion just medial to patellar tendon. Mild swelling of medial knee. Tender over medial meniscus.  +McMurrays.  No joint laxity.  Neurological: She is alert and oriented to person, place, and time.    ED Course  Injection of joint Date/Time: 08/18/2014 7:49 PM Performed by: Charm RingsHONIG, Devonia Farro J Authorized by: Charm RingsHONIG, Bee Marchiano J Consent: Verbal consent obtained. Risks and benefits: risks, benefits and alternatives were discussed Consent given by: patient Patient understanding: patient states understanding of the procedure being performed Patient identity confirmed: verbally with patient Time out: Immediately prior to procedure a "time out" was called to verify the correct patient, procedure, equipment, support staff and site/side marked as required. Preparation: Patient was prepped and draped in the usual sterile fashion. Local anesthesia used: yes Local anesthetic: co-phenylcaine spray Patient sedated: no Patient tolerance: Patient tolerated the procedure well with no immediate complications Comments: Left knee injected from an anteromedial approach with 40 mg Depo-Medrol and 3 mL of 2% lidocaine.   (including critical care time) Labs Review Labs Reviewed - No data to display  Imaging Review No results found.   MDM   1. Medial meniscus tear, right, initial encounter    Knee injected with Depo-Medrol today. Discussed symptomatic care with ice, ibuprofen, elevation. Expect improvement in the next week. If she is not improving or develops mechanical symptoms, she should follow-up with orthopedics.    Charm RingsErin J Shemuel Harkleroad, MD 08/18/14 [phone]

## 2014-08-18 NOTE — ED Notes (Signed)
Fell on 11/9 and landed on L knee.  C/o pain and swelling.  Was seen by school nurse and was told ice and elevation for several days.  Saw her again Tues and was told to get it checked since it was not better.  Has healing abrasion.  Knelt down on it on Sunday and felt shooting pains in it.

## 2016-12-05 ENCOUNTER — Ambulatory Visit: Payer: Self-pay

## 2016-12-06 ENCOUNTER — Ambulatory Visit (INDEPENDENT_AMBULATORY_CARE_PROVIDER_SITE_OTHER): Payer: BC Managed Care – PPO | Admitting: Emergency Medicine

## 2016-12-06 ENCOUNTER — Encounter: Payer: Self-pay | Admitting: Physician Assistant

## 2016-12-06 VITALS — BP 124/84 | HR 84 | Temp 98.4°F | Resp 16 | Ht 65.0 in | Wt 186.0 lb

## 2016-12-06 DIAGNOSIS — R030 Elevated blood-pressure reading, without diagnosis of hypertension: Secondary | ICD-10-CM | POA: Diagnosis not present

## 2016-12-06 DIAGNOSIS — R519 Headache, unspecified: Secondary | ICD-10-CM

## 2016-12-06 DIAGNOSIS — R51 Headache: Secondary | ICD-10-CM

## 2016-12-06 NOTE — Progress Notes (Addendum)
Kristina Gilmore 41 y.o.   Chief Complaint  Patient presents with  . Hypertension    headache since last Thursday     HISTORY OF PRESENT ILLNESS: This is a 41 y.o. female complaining of intermittent BP elevation; has had intermittent headaches responsive to Advil. Systolic range 140-150 and diastolic 80-90's. [employer], broadcasting/film/videoteacher.Had general physical with blood work last November and told everything was normal.  Hypertension  This is a new problem. The current episode started in the past 7 days. The problem has been waxing and waning since onset. The problem is controlled. Pertinent negatives include no blurred vision, chest pain, malaise/fatigue, neck pain, palpitations, peripheral edema, PND or shortness of breath. Headaches: intermittent. There are no associated agents to hypertension. There are no known risk factors for coronary artery disease. Past treatments include nothing.     Prior to Admission medications   Medication Sig Start Date End Date Taking? Authorizing Provider  ibuprofen (ADVIL,MOTRIN) 800 MG tablet Take 1 tablet (800 mg total) by mouth every 8 (eight) hours as needed. Patient not taking: Reported on 12/06/2016 08/18/14   Charm RingsErin J Honig, MD    Allergies  Allergen Reactions  . Darvon [Propoxyphene] Itching and Rash  . Percocet [Oxycodone-Acetaminophen] Itching and Rash    There are no active problems to display for this patient.   History reviewed. No pertinent past medical history.  Past Surgical History:  Procedure Laterality Date  . CERVICAL CONE BIOPSY  2004  . CESAREAN SECTION  2008    Social History   Social History  . Marital status: Married    Spouse name: N/A  . Number of children: N/A  . Years of education: N/A   Occupational History  . Not on file.   Social History Main Topics  . Smoking status: Never Smoker  . Smokeless tobacco: Never Used  . Alcohol use Yes     Comment: occasional  . Drug use: No  . Sexual activity: Yes    Birth control/  protection: IUD   Other Topics Concern  . Not on file   Social History Narrative  . No narrative on file    Family History  Problem Relation Age of Onset  . Cancer Mother   . Diabetes Father   . Diabetes Brother   . Stroke Maternal Grandmother   . Cancer Maternal Grandmother   . Hyperlipidemia Paternal Grandmother      Review of Systems  Constitutional: Negative for chills, fever and malaise/fatigue.  HENT: Negative for hearing loss and nosebleeds.   Eyes: Negative for blurred vision and double vision.  Respiratory: Negative for shortness of breath.   Cardiovascular: Negative for chest pain, palpitations and PND.  Gastrointestinal: Negative for abdominal pain, nausea and vomiting.  Genitourinary: Negative for hematuria.  Musculoskeletal: Negative for neck pain.  Skin: Negative for rash.  Neurological: Negative for dizziness. Headaches: intermittent.  Endo/Heme/Allergies:       Goiter with euthyroidsm  All other systems reviewed and are negative.  Vitals:   12/06/16 0931  BP: 124/84  Pulse: 84  Resp: 16  Temp: 98.4 F (36.9 C)     Physical Exam  Constitutional: She is oriented to person, place, and time. She appears well-developed and well-nourished.  HENT:  Head: Normocephalic and atraumatic.  Nose: Nose normal.  Mouth/Throat: Oropharynx is clear and moist.  Eyes: Conjunctivae and EOM are normal. Pupils are equal, round, and reactive to light.  Neck: Normal range of motion. Neck supple. No JVD present. Thyromegaly (mild enlargement)  present.  Cardiovascular: Normal rate, regular rhythm and normal heart sounds.   Pulmonary/Chest: Effort normal and breath sounds normal.  Abdominal: Soft. She exhibits no distension. There is no tenderness.  Musculoskeletal: Normal range of motion.  Lymphadenopathy:    She has no cervical adenopathy.  Neurological: She is alert and oriented to person, place, and time. No sensory deficit. She exhibits normal muscle tone.  Skin:  Skin is warm and dry. Capillary refill takes less than 2 seconds.  Psychiatric: She has a normal mood and affect. Her behavior is normal.  Vitals reviewed.    ASSESSMENT & PLAN: Daisha was seen today for hypertension.  Diagnoses and all orders for this visit:  Transient hypertension  Intermittent headache    Patient Instructions       IF you received an x-ray today, you will receive an invoice from [facility]. Please contact [facility] at [phone] with questions or concerns regarding your invoice.   IF you received labwork today, you will receive an invoice from [facility]. Please contact [facility] at [phone] with questions or concerns regarding your invoice.   Our billing staff will not be able to assist you with questions regarding bills from these companies.  You will be contacted with the lab results as soon as they are available. The fastest way to get your results is to activate your My Chart account. Instructions are located on the last page of this paperwork. If you have not heard from [place] regarding the results in 2 weeks, please contact this office.      Hypertension Hypertension is another name for high blood pressure. High blood pressure forces your heart to work harder to pump blood. This can cause problems over time. There are two numbers in a blood pressure reading. There is a top number (systolic) over a bottom number (diastolic). It is best to have a blood pressure below 120/80. Healthy choices can help lower your blood pressure. You may need medicine to help lower your blood pressure if:  Your blood pressure cannot be lowered with healthy choices.  Your blood pressure is higher than 130/80. Follow these instructions at home: Eating and drinking   If directed, follow the DASH eating plan. This diet includes:  Filling half of your plate at each meal with fruits and vegetables.  Filling one quarter of your plate at each meal with  whole grains. Whole grains include whole wheat pasta, brown rice, and whole grain bread.  Eating or drinking low-fat dairy products, such as skim milk or low-fat yogurt.  Filling one quarter of your plate at each meal with low-fat (lean) proteins. Low-fat proteins include fish, skinless chicken, eggs, beans, and tofu.  Avoiding fatty meat, cured and processed meat, or chicken with skin.  Avoiding premade or processed food.  Eat less than 1,500 mg of salt (sodium) a day.  Limit alcohol use to no more than 1 drink a day for nonpregnant women and 2 drinks a day for men. One drink equals 12 oz of beer, 5 oz of wine, or 1 oz of hard liquor. Lifestyle   Work with your doctor to stay at a healthy weight or to lose weight. Ask your doctor what the best weight is for you.  Get at least 30 minutes of exercise that causes your heart to beat faster (aerobic exercise) most days of the week. This may include walking, swimming, or biking.  Get at least 30 minutes of exercise that strengthens your muscles (resistance exercise) at least 3  days a week. This may include lifting weights or pilates.  Do not use any products that contain nicotine or tobacco. This includes cigarettes and e-cigarettes. If you need help quitting, ask your doctor.  Check your blood pressure at home as told by your doctor.  Keep all follow-up visits as told by your doctor. This is important. Medicines   Take over-the-counter and prescription medicines only as told by your doctor. Follow directions carefully.  Do not skip doses of blood pressure medicine. The medicine does not work as well if you skip doses. Skipping doses also puts you at risk for problems.  Ask your doctor about side effects or reactions to medicines that you should watch for. Contact a doctor if:  You think you are having a reaction to the medicine you are taking.  You have headaches that keep coming back (recurring).  You feel dizzy.  You have  swelling in your ankles.  You have trouble with your vision. Get help right away if:  You get a very bad headache.  You start to feel confused.  You feel weak or numb.  You feel faint.  You get very bad pain in your:  Chest.  Belly (abdomen).  You throw up (vomit) more than once.  You have trouble breathing. Summary  Hypertension is another name for high blood pressure.  Making healthy choices can help lower blood pressure. If your blood pressure cannot be controlled with healthy choices, you may need to take medicine. This information is not intended to replace advice given to you by your health care provider. Make sure you discuss any questions you have with your health care provider. Document Released: 03/06/2008 Document Revised: 08/16/2016 Document Reviewed: 08/16/2016 Elsevier Interactive Patient Education  2017 [employer]      Edwina Barth, MD [facility]

## 2016-12-06 NOTE — Patient Instructions (Addendum)
   IF you received an x-ray today, you will receive an invoice from [facility]. Please contact [facility] at [phone] with questions or concerns regarding your invoice.   IF you received labwork today, you will receive an invoice from [facility]. Please contact [facility] at [phone] with questions or concerns regarding your invoice.   Our billing staff will not be able to assist you with questions regarding bills from these companies.  You will be contacted with the lab results as soon as they are available. The fastest way to get your results is to activate your My Chart account. Instructions are located on the last page of this paperwork. If you have not heard from us regarding the results in 2 weeks, please contact this office.      Hypertension Hypertension is another name for high blood pressure. High blood pressure forces your heart to work harder to pump blood. This can cause problems over time. There are two numbers in a blood pressure reading. There is a top number (systolic) over a bottom number (diastolic). It is best to have a blood pressure below 120/80. Healthy choices can help lower your blood pressure. You may need medicine to help lower your blood pressure if:  Your blood pressure cannot be lowered with healthy choices.  Your blood pressure is higher than 130/80. Follow these instructions at home: Eating and drinking   If directed, follow the DASH eating plan. This diet includes:  Filling half of your plate at each meal with fruits and vegetables.  Filling one quarter of your plate at each meal with whole grains. Whole grains include whole wheat pasta, brown rice, and whole grain bread.  Eating or drinking low-fat dairy products, such as skim milk or low-fat yogurt.  Filling one quarter of your plate at each meal with low-fat (lean) proteins. Low-fat proteins include fish, skinless chicken, eggs, beans, and tofu.  Avoiding fatty meat,  cured and processed meat, or chicken with skin.  Avoiding premade or processed food.  Eat less than 1,500 mg of salt (sodium) a day.  Limit alcohol use to no more than 1 drink a day for nonpregnant women and 2 drinks a day for men. One drink equals 12 oz of beer, 5 oz of wine, or 1 oz of hard liquor. Lifestyle   Work with your doctor to stay at a healthy weight or to lose weight. Ask your doctor what the best weight is for you.  Get at least 30 minutes of exercise that causes your heart to beat faster (aerobic exercise) most days of the week. This may include walking, swimming, or biking.  Get at least 30 minutes of exercise that strengthens your muscles (resistance exercise) at least 3 days a week. This may include lifting weights or pilates.  Do not use any products that contain nicotine or tobacco. This includes cigarettes and e-cigarettes. If you need help quitting, ask your doctor.  Check your blood pressure at home as told by your doctor.  Keep all follow-up visits as told by your doctor. This is important. Medicines   Take over-the-counter and prescription medicines only as told by your doctor. Follow directions carefully.  Do not skip doses of blood pressure medicine. The medicine does not work as well if you skip doses. Skipping doses also puts you at risk for problems.  Ask your doctor about side effects or reactions to medicines that you should watch for. Contact a doctor if:  You think you are having a   reaction to the medicine you are taking.  You have headaches that keep coming back (recurring).  You feel dizzy.  You have swelling in your ankles.  You have trouble with your vision. Get help right away if:  You get a very bad headache.  You start to feel confused.  You feel weak or numb.  You feel faint.  You get very bad pain in your:  Chest.  Belly (abdomen).  You throw up (vomit) more than once.  You have trouble  breathing. Summary  Hypertension is another name for high blood pressure.  Making healthy choices can help lower blood pressure. If your blood pressure cannot be controlled with healthy choices, you may need to take medicine. This information is not intended to replace advice given to you by your health care provider. Make sure you discuss any questions you have with your health care provider. Document Released: 03/06/2008 Document Revised: 08/16/2016 Document Reviewed: 08/16/2016 Elsevier Interactive Patient Education  2017 [employer]  

## 2019-08-18 ENCOUNTER — Ambulatory Visit: Payer: Self-pay | Admitting: Surgery

## 2019-08-18 NOTE — H&P (Signed)
Kristina Gilmore Documented: 08/18/2019 9:50 AM [facility] Patient #: [id number] DOB: Feb 09, 1976 Divorced / Language: English / Race: Black or African American Female  History of Present Illness Kristina Sportsman MD; 08/18/2019 10:37 AM) The patient is a 43 year old female who presents with rectal prolapse. Note for "Rectal prolapse": ` ` ` Patient sent for surgical consultation at the request of Dr Ambrose Mantle  Chief Complaint: Partial rectal prolapse ` ` The patient is a pleasant woman. She's had 4 children. Usually these had a hemorrhoid flare near the end. Usually gets better on its own. However her OB/GYN noticed something prolapsing out of the anus a few years ago. It is persistent. Patient thinks maybe gotten slightly larger. She'll get leaking and occasional itching and discomfort. Struggle with constipation but since she became a vegan this summer, her bowels are moving on a daily basis. No fevers or chills. No nausea or vomiting. He does not smoke. She is not diabetic. She works as a Chartered loss adjuster. No heavy lifting.  No personal nor family history of GI/colon cancer, inflammatory bowel disease, irritable bowel syndrome, allergy such as Celiac Sprue, dietary/dairy problems, colitis, ulcers nor gastritis. No recent sick contacts/gastroenteritis. No travel outside the country. No changes in diet. No dysphagia to solids or liquids. No significant heartburn or reflux. No hematochezia, hematemesis, coffee ground emesis. No evidence of prior gastric/peptic ulceration.  (Review of systems as stated in this history (HPI) or in the review of systems. Otherwise all other 12 point ROS are negative) ` ` `   Past Surgical History Kristina Gilmore, CMA; 08/18/2019 9:50 AM) Cesarean Section - 1  Diagnostic Studies History Kristina Gilmore, CMA; 08/18/2019 9:50 AM) Colonoscopy never Mammogram within last year Pap Smear 1-5 years ago  Allergies Kristina Gilmore,  CMA; 08/18/2019 9:54 AM) Darvon *ANALGESICS - OPIOID* Percocet *ANALGESICS - OPIOID* Allergies Reconciled  Medication History Kristina Gilmore, CMA; 08/18/2019 9:55 AM) ALPRAZolam (0.5MG  Tablet, Oral) Active. Cetirizine HCl (  Tablet, Oral) Active. Montelukast Sodium (  Tablet, Oral) Active. hydroCHLOROthiazide (  Tablet, Oral) Active. Medications Reconciled  Social History Kristina Gilmore, CMA; 08/18/2019 9:50 AM) Alcohol use Moderate alcohol use. Caffeine use Carbonated beverages, Coffee, Tea. No drug use Tobacco use Never smoker.  Family History Kristina Gilmore, [place]; 08/18/2019 9:50 AM) Heart Disease Brother. Hypertension Brother, Father. Thyroid problems Mother.  Pregnancy / Birth History Kristina Gilmore, CMA; 08/18/2019 9:50 AM) Age at menarche 8 years. Contraceptive History Contraceptive implant. Length (months) of breastfeeding 7-12 Maternal age 26-25 Para 4 Regular periods  Other Problems Kristina Gilmore, CMA; 08/18/2019 9:50 AM) Anxiety Disorder Hemorrhoids High blood pressure     Review of Systems Kristina Gilmore CMA; 08/18/2019 9:50 AM) General Not Present- Appetite Loss, Chills, Fatigue, Fever, Night Sweats, Weight Gain and Weight Loss. Skin Not Present- Change in Wart/Mole, Dryness, Hives, Jaundice, New Lesions, Non-Healing Wounds, Rash and Ulcer. HEENT Present- Seasonal Allergies. Not Present- Earache, Hearing Loss, Hoarseness, Nose Bleed, Oral Ulcers, Ringing in the Ears, Sinus Pain, Sore Throat, Visual Disturbances, Wears glasses/contact lenses and Yellow Eyes. Respiratory Not Present- Bloody sputum, Chronic Cough, Difficulty Breathing, Snoring and Wheezing. Breast Not Present- Breast Mass, Breast Pain, Nipple Discharge and Skin Changes. Cardiovascular Not Present- Chest Pain, Difficulty Breathing Lying Down, Leg Cramps, Palpitations, Rapid Heart Rate, Shortness of Breath and Swelling of Extremities. Gastrointestinal Present- Hemorrhoids.  Not Present- Abdominal Pain, Bloating, Bloody Stool, Change in Bowel Habits, Chronic diarrhea, Constipation, Difficulty Swallowing, Excessive gas, Gets full quickly at meals, Indigestion, Nausea, Rectal Pain and Vomiting. Female  Genitourinary Not Present- Frequency, Nocturia, Painful Urination, Pelvic Pain and Urgency. Musculoskeletal Not Present- Back Pain, Joint Pain, Joint Stiffness, Muscle Pain, Muscle Weakness and Swelling of Extremities. Neurological Not Present- Decreased Memory, Fainting, Headaches, Numbness, Seizures, Tingling, Tremor, Trouble walking and Weakness. Psychiatric Present- Anxiety and Change in Sleep Pattern. Not Present- Bipolar, Depression, Fearful and Frequent crying. Endocrine Not Present- Cold Intolerance, Excessive Hunger, Hair Changes, Heat Intolerance, Hot flashes and New Diabetes. Hematology Not Present- Blood Thinners, Easy Bruising, Excessive bleeding, Gland problems, HIV and Persistent Infections.  Vitals Kristina Gilmore CMA; 08/18/2019 9:52 AM) 08/18/2019 9:52 AM Weight: 178.2 lb Height: 65in Body Surface Area: 1.88 m Body Mass Index: 29.65 kg/m  Temp.: 48F  Pulse: 78 (Regular)  BP: 120/80 (Sitting, Left Arm, Standard)        Physical Exam Kristina Hector MD; 08/18/2019 10:07 AM)  General Mental Status-Alert. General Appearance-Not in acute distress, Not Sickly. Orientation-Oriented X3. Hydration-Well hydrated. Voice-Normal.  Integumentary Global Assessment Upon inspection and palpation of skin surfaces of the - Axillae: non-tender, no inflammation or ulceration, no drainage. and Distribution of scalp and body hair is normal. General Characteristics Temperature - normal warmth is noted.  Head and Neck Head-normocephalic, atraumatic with no lesions or palpable masses. Face Global Assessment - atraumatic, no absence of expression. Neck Global Assessment - no abnormal movements, no bruit auscultated on the right, no  bruit auscultated on the left, no decreased range of motion, non-tender. Trachea-midline. Thyroid Gland Characteristics - non-tender.  Eye Eyeball - Left-Extraocular movements intact, No Nystagmus - Left. Eyeball - Right-Extraocular movements intact, No Nystagmus - Right. Cornea - Left-No Hazy - Left. Cornea - Right-No Hazy - Right. Sclera/Conjunctiva - Left-No scleral icterus, No Discharge - Left. Sclera/Conjunctiva - Right-No scleral icterus, No Discharge - Right. Pupil - Left-Direct reaction to light normal. Pupil - Right-Direct reaction to light normal.  ENMT Ears Pinna - Left - no drainage observed, no generalized tenderness observed. Pinna - Right - no drainage observed, no generalized tenderness observed. Nose and Sinuses External Inspection of the Nose - no destructive lesion observed. Inspection of the nares - Left - quiet respiration. Inspection of the nares - Right - quiet respiration. Mouth and Throat Lips - Upper Lip - no fissures observed, no pallor noted. Lower Lip - no fissures observed, no pallor noted. Nasopharynx - no discharge present. Oral Cavity/Oropharynx - Tongue - no dryness observed. Oral Mucosa - no cyanosis observed. Hypopharynx - no evidence of airway distress observed.  Chest and Lung Exam Inspection Movements - Normal and Symmetrical. Accessory muscles - No use of accessory muscles in breathing. Palpation Palpation of the chest reveals - Non-tender. Auscultation Breath sounds - Normal and Clear.  Cardiovascular Auscultation Rhythm - Regular. Murmurs & Other Heart Sounds - Auscultation of the heart reveals - No Murmurs and No Systolic Clicks.  Abdomen Inspection Inspection of the abdomen reveals - No Visible peristalsis and No Abnormal pulsations. Umbilicus - No Bleeding, No Urine drainage. Palpation/Percussion Palpation and Percussion of the abdomen reveal - Soft, Non Tender, No Rebound tenderness, No Rigidity (guarding) and No  Cutaneous hyperesthesia. Note: Abdomen soft. Not severely distended. Mild diastasis recti. No umbilical or other anterior abdominal wall hernias  Female Genitourinary Sexual Maturity Tanner 5 - Adult hair pattern. Note: No vaginal bleeding nor discharge  Rectal Note: Large right posterior chronically prolapsed hemorrhoid with external component.  Please refer to anoscopy section.  Peripheral Vascular Upper Extremity Inspection - Left - No Cyanotic nailbeds - Left, Not Ischemic. Inspection -  Right - No Cyanotic nailbeds - Right, Not Ischemic.  Neurologic Neurologic evaluation reveals -normal attention span and ability to concentrate, able to name objects and repeat phrases. Appropriate fund of knowledge , normal sensation and normal coordination. Mental Status Affect - not angry, not paranoid. Cranial Nerves-Normal Bilaterally. Gait-Normal.  Neuropsychiatric Mental status exam performed with findings of-able to articulate well with normal speech/language, rate, volume and coherence, thought content normal with ability to perform basic computations and apply abstract reasoning and no evidence of hallucinations, delusions, obsessions or homicidal/suicidal ideation.  Musculoskeletal Global Assessment Spine, Ribs and Pelvis - no instability, subluxation or laxity. Right Upper Extremity - no instability, subluxation or laxity.  Lymphatic Head & Neck  General Head & Neck Lymphatics: Bilateral - Description - No Localized lymphadenopathy. Axillary  General Axillary Region: Bilateral - Description - No Localized lymphadenopathy. Femoral & Inguinal  Generalized Femoral & Inguinal Lymphatics: Left - Description - No Localized lymphadenopathy. Right - Description - No Localized lymphadenopathy.   Results Kristina Gilmore(Parry Po C. Helmer Dull MD; 08/18/2019 10:25 AM) Procedures  Name Value Date Hemorrhoids Procedure Anal exam: External Hemorrhoid Internal exam: Internal  Hemorroids ( non-bleeding) prolapse Other: Large right posterior chronically prolapsed hemorrhoid with external tag component as well............Marland Kitchen.Perianal skin clean with good hygiene. No pruritis ani. No pilonidal disease. No fissure. No abscess/fistula. Normal sphincter tone. Some fullness in the anterior midline raphae. Possible right posterior external hemorrhoid No other hemorrhoids. No condyloma warts......Marland Kitchen.Tolerates digital and anoscopic rectal exam. No rectal masses. Hemorrhoidal piles grade 2 right anterior and left lateral. Some mild rectal redundancy but no true prolapse. Exam done with assistance of female Medical Assistant in the room.  Performed: 08/18/2019 10:07 AM    Assessment & Plan Kristina Gilmore(Dezmond Downie C. Chanele Douglas MD; 08/18/2019 10:25 AM)  PROLAPSED INTERNAL HEMORRHOIDS, GRADE 4 (K64.3) Impression: Chronically right posterior prolapsed hemorrhoid that explains the concern of prolapse. External carotid.  This requires surgery to remove. Plan examination under anesthesia, internal hemorrhoidal ligation and pexy all piles, excision of any external tissue.  I think her risks for surgery are low, but this is a painful procedure and she will need at least 2 weeks out from work to recover. She is interested in proceeding since is becoming more of a problem. We will work to coordinate a convenient time  The anatomy & physiology of the anorectal region was discussed. The pathophysiology of hemorrhoids and differential diagnosis was discussed. Natural history progression was discussed. I stressed the importance of a bowel regimen to have daily soft bowel movements to minimize progression of disease. Goal of one BM / day ideal. Use of wet wipes, warm baths, avoiding straining, etc were emphasized.  Educational handouts further explaining the pathology, treatment options, and bowel regimen were given as well. The patient expressed understanding.  Current Plans You are being  scheduled for surgery- Our schedulers will call you.  You should hear from our office's scheduling department within 5 working days about the location, date, and time of surgery. We try to make accommodations for patient's preferences in scheduling surgery, but sometimes the OR schedule or the surgeon's schedule prevents us from making those accommodations.  If you have not heard from our office [phone]) in 5 working days, call the office and ask for your surgeon's nurse.  If you have other questions about your diagnosis, plan, or surgery, call the office and ask for your surgeon's nurse.  ANOSCOPY, DIAGNOSTIC (46600)  EXTERNAL HEMORRHOIDS WITH COMPLICATION (K64.4)   PROLAPSED INTERNAL HEMORRHOIDS, GRADE 2 (K64.1) Impression: Plan internal  hemorrhoidal ligation and pexing  Current Plans Pt Education - [facility] Hemorrhoids (Anastasija Anfinson): discussed with patient and provided information. Pt Education - Pamphlet Given - The Hemorrhoid Book: discussed with patient and provided information.  ENCOUNTER FOR PREOPERATIVE EXAMINATION FOR GENERAL SURGICAL PROCEDURE (Z01.818)  Current Plans You are being scheduled for surgery- Our schedulers will call you.  You should hear from our office's scheduling department within 5 working days about the location, date, and time of surgery. We try to make accommodations for patient's preferences in scheduling surgery, but sometimes the OR schedule or the surgeon's schedule prevents [place] from making those accommodations.  If you have not heard from our office [phone] in 5 working days, call the office and ask for your surgeon's nurse.  If you have other questions about your diagnosis, plan, or surgery, call the office and ask for your surgeon's nurse.  Pt Education - [facility] Rectal Prep for Anorectal outpatient/office surgery: discussed with patient and provided information. Pt Education - [facility] Rectal Surgery HCI (Renetta Suman): discussed with patient and provided  information. Pt Education - [facility] Good Bowel Health (Jihan Mellette)  Kristina Sportsman, MD, FACS, MASCRS Gastrointestinal and Minimally Invasive Surgery  [facility] [address] [place], [place] [address] [phone] Main / Paging [phone] Fax

## 2019-10-17 ENCOUNTER — Other Ambulatory Visit: Payer: Self-pay | Admitting: Surgery

## 2021-12-10 ENCOUNTER — Emergency Department (HOSPITAL_COMMUNITY): Payer: BC Managed Care – PPO

## 2021-12-10 ENCOUNTER — Encounter (HOSPITAL_COMMUNITY): Payer: Self-pay

## 2021-12-10 ENCOUNTER — Other Ambulatory Visit: Payer: Self-pay

## 2021-12-10 ENCOUNTER — Emergency Department (HOSPITAL_COMMUNITY)
Admission: EM | Admit: 2021-12-10 | Discharge: 2021-12-10 | Disposition: A | Discharge status: Home / Self Care | Payer: BC Managed Care – PPO | Attending: Emergency Medicine | Admitting: Emergency Medicine

## 2021-12-10 DIAGNOSIS — S161XXA Strain of muscle, fascia and tendon at neck level, initial encounter: Secondary | ICD-10-CM

## 2021-12-10 DIAGNOSIS — R202 Paresthesia of skin: Secondary | ICD-10-CM | POA: Insufficient documentation

## 2021-12-10 DIAGNOSIS — R519 Headache, unspecified: Secondary | ICD-10-CM | POA: Insufficient documentation

## 2021-12-10 DIAGNOSIS — Y92410 Unspecified street and highway as the place of occurrence of the external cause: Secondary | ICD-10-CM | POA: Insufficient documentation

## 2021-12-10 HISTORY — DX: Panic disorder (episodic paroxysmal anxiety): F41.0

## 2021-12-10 HISTORY — DX: Anxiety disorder, unspecified: F41.9

## 2021-12-10 LAB — I-STAT BETA HCG BLOOD, ED (MC, WL, AP ONLY): I-stat hCG, quantitative: <5 m[IU]/mL (ref <5)

## 2021-12-10 MED ORDER — KETOROLAC TROMETHAMINE 60 MG/2ML IM SOLN
30.0000 mg | Freq: Once | INTRAMUSCULAR | Status: AC
  Administered 2021-12-10: 30 mg via INTRAMUSCULAR
  Filled 2021-12-10: qty 2

## 2021-12-10 MED ORDER — METHOCARBAMOL 500 MG PO TABS
500.0000 mg | ORAL_TABLET | Freq: Once | ORAL | Status: AC
  Administered 2021-12-10: 500 mg via ORAL
  Filled 2021-12-10: qty 1

## 2021-12-10 MED ORDER — METHOCARBAMOL 500 MG PO TABS: 500.0000 mg | ORAL_TABLET | Freq: Three times a day (TID) | ORAL | 0 refills | Status: DC | PRN

## 2021-12-10 MED ORDER — ACETAMINOPHEN 500 MG PO TABS
500.0000 mg | ORAL_TABLET | Freq: Once | ORAL | Status: AC
  Administered 2021-12-10: 500 mg via ORAL
  Filled 2021-12-10: qty 1
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
## 2021-12-10 NOTE — Discharge Instructions (Addendum)
If you develop chest pain, difficulty breathing, abdominal pain, numbness, weakness or other new concerning symptom, come back to ER. Take tylenol, motrin as needed for pain control. Can also try the muscle relaxer.  ?

## 2022-02-07 ENCOUNTER — Other Ambulatory Visit: Payer: Self-pay | Admitting: Obstetrics and Gynecology

## 2022-02-07 DIAGNOSIS — N6310 Unspecified lump in the right breast, unspecified quadrant: Secondary | ICD-10-CM

## 2022-02-09 ENCOUNTER — Ambulatory Visit: Payer: BC Managed Care – PPO | Admitting: Family Medicine

## 2022-02-09 VITALS — BP 130/80 | HR 86 | Ht 65.0 in | Wt 185.0 lb

## 2022-02-09 DIAGNOSIS — S060X0A Concussion without loss of consciousness, initial encounter: Secondary | ICD-10-CM | POA: Diagnosis not present

## 2022-02-09 MED ORDER — NORTRIPTYLINE HCL 25 MG PO CAPS: 25.0000 mg | ORAL_CAPSULE | Freq: Every day | ORAL | 1 refills | Status: DC
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
## 2022-02-09 NOTE — Patient Instructions (Addendum)
Thank you for coming in today.  ?Nortriptyline daily at bedtime ?Referral to speech therapy sent in  ?Check back in four weeks ?

## 2022-02-17 ENCOUNTER — Ambulatory Visit
Admission: RE | Admit: 2022-02-17 | Discharge: 2022-02-17 | Disposition: A | Discharge status: Home / Self Care | Payer: BC Managed Care – PPO | Source: Ambulatory Visit | Attending: Obstetrics and Gynecology | Admitting: Obstetrics and Gynecology

## 2022-02-17 ENCOUNTER — Ambulatory Visit: Payer: BC Managed Care – PPO | Attending: Family Medicine

## 2022-02-17 DIAGNOSIS — R41841 Cognitive communication deficit: Secondary | ICD-10-CM | POA: Diagnosis present

## 2022-02-17 DIAGNOSIS — N6310 Unspecified lump in the right breast, unspecified quadrant: Secondary | ICD-10-CM
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
## 2022-03-01 ENCOUNTER — Encounter: Payer: Self-pay | Admitting: Speech Pathology

## 2022-03-01 ENCOUNTER — Ambulatory Visit: Payer: BC Managed Care – PPO | Admitting: Speech Pathology

## 2022-03-01 DIAGNOSIS — R41841 Cognitive communication deficit: Secondary | ICD-10-CM

## 2022-03-01 NOTE — Patient Instructions (Signed)
    IG sites:  Complete concussions  Concussion_doc  The concussion community  Remember - no one can see your brain and you look great - people will forget that your brain is healing and that you need some extra time, less work and support - remind them!  Consider your priorities (work, kids) and setting boundries with extended family and friends  When you do extra work, work late into the night or push, you are taking energy from the next day - give yourself some grace to focus on your kids and work and not other extra stuff  When you are processing in conversation limit noise including TV, radio, appliances, shut doors - be aware of how you are processing in higher stimulation environments  Group conversations may be harder to process - try to limit conversations to 1-2 people  If you are in a large group or high stimulation places, take a break in a quiet dark place to let your brain reset (10 to 15 minutes)  Ask for information in writing - that way you can process the information in your own time  Word finding can get worse when you are fatigued and have a headache  We want you to rest your brain BEFORE you get a head ache or brain fog - listen to  your body - take breaks with no noise or lights for short periods (no music either)
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
## 2022-03-08 ENCOUNTER — Ambulatory Visit: Payer: BC Managed Care – PPO | Attending: Family Medicine

## 2022-03-08 DIAGNOSIS — R41841 Cognitive communication deficit: Secondary | ICD-10-CM | POA: Diagnosis present
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
## 2022-03-08 NOTE — Patient Instructions (Signed)
Consider recording your boss/coworkers to ensure you capture all the information you may need   Your energy levels are limited at this time. We are requesting for your children to help you out at home. You are trying to manage to much at this time.

## 2022-03-09 ENCOUNTER — Encounter: Payer: Self-pay | Admitting: Family Medicine

## 2022-03-09 ENCOUNTER — Ambulatory Visit: Payer: BC Managed Care – PPO | Admitting: Family Medicine

## 2022-03-09 VITALS — BP 112/84 | HR 97 | Ht 65.0 in | Wt 186.8 lb

## 2022-03-09 DIAGNOSIS — S060X0D Concussion without loss of consciousness, subsequent encounter: Secondary | ICD-10-CM

## 2022-03-09 DIAGNOSIS — R519 Headache, unspecified: Secondary | ICD-10-CM | POA: Diagnosis not present

## 2022-03-09 DIAGNOSIS — N946 Dysmenorrhea, unspecified: Secondary | ICD-10-CM | POA: Insufficient documentation

## 2022-03-09 DIAGNOSIS — G4701 Insomnia due to medical condition: Secondary | ICD-10-CM
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
## 2022-03-09 NOTE — Patient Instructions (Addendum)
Good to see you today.  Con't w/ speech therapy.  Follow-up: 3 weeks  Let me know if your insomnia is not improving after graduation so I can prescribe something different.

## 2022-03-15 ENCOUNTER — Ambulatory Visit: Payer: BC Managed Care – PPO

## 2022-03-15 DIAGNOSIS — R41841 Cognitive communication deficit: Secondary | ICD-10-CM | POA: Diagnosis not present
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
## 2022-03-15 NOTE — Patient Instructions (Signed)
How to prepare for 10 hours day Prepare lunch the night before  Eat the lunch and "brain break" Mental breaks when necessary   Consider how fatigue, stress, stimulation, and emotions play a role in your thinking and communication

## 2022-03-22 ENCOUNTER — Encounter: Payer: Self-pay | Admitting: Speech Pathology

## 2022-03-22 ENCOUNTER — Ambulatory Visit: Payer: BC Managed Care – PPO | Admitting: Speech Pathology

## 2022-03-22 DIAGNOSIS — R41841 Cognitive communication deficit: Secondary | ICD-10-CM

## 2022-03-22 NOTE — Patient Instructions (Signed)
   This week, start back to some higher level reading  Again, use all of your strategies of brain breaks  Stop every few pages and mentally recap what you have just read  Read in quiet place with no interruptions (phone, kids etc)  Use a bookmark or index card as needed to make it easier on your brain  Before you start reading, mentally review where you left off and what information you learned from your past reading
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
## 2022-03-29 ENCOUNTER — Ambulatory Visit: Payer: BC Managed Care – PPO

## 2022-03-29 DIAGNOSIS — R41841 Cognitive communication deficit: Secondary | ICD-10-CM

## 2022-03-29 NOTE — Therapy (Signed)
OUTPATIENT SPEECH LANGUAGE PATHOLOGY TREATMENT NOTE (DISCHARGE)   Patient Name: Kristina Gilmore MRN: [id number] DOB:06/08/1976, 46 y.o., female Today's Date: 03/29/2022  PCP:  Dr. Lin Landsman REFERRING PROVIDER:  Dr. Sherene Sires  END OF SESSION:   End of Session - 03/29/22 1146     Visit Number 6    Number of Visits 8    Date for SLP Re-Evaluation 04/14/22    Authorization Type [employer]    SLP Start Time 1146    SLP Stop Time  1225    SLP Time Calculation (min) 39 min    Activity Tolerance Patient tolerated treatment well             SPEECH THERAPY DISCHARGE SUMMARY  Visits from Start of Care: 6  Current functional level related to goals / functional outcomes: Amandy has exhibited significant improvements in cognitive linguistic skills with use of strategies and compensations educated and trained by SLP team. No other overt concerns or challenges reported at this time. Pt is pleased with current level of functioning and agreeable to ST discharge.    Remaining deficits: Post concussion symptoms   Education / Equipment: Cognitive compensations, word retrieval strategies, functional application   Patient agrees to discharge. Patient goals were met. Patient is being discharged due to meeting the stated rehab goals..     Past Medical History:  Diagnosis Date   Anxiety    Panic attacks    Past Surgical History:  Procedure Laterality Date   CERVICAL CONE BIOPSY  2004   CESAREAN SECTION  2008   Patient Active Problem List   Diagnosis Date Noted   Dysmenorrhea 03/09/2022   Transient hypertension 12/06/2016   Intermittent headache 12/06/2016    ONSET DATE: 12/10/21  REFERRING DIAG: S06.0X0A (ICD-10-CM) - Concussion without loss of consciousness, initial encounter  THERAPY DIAG: Cognitive communication deficit  Rationale for Evaluation and Treatment Rehabilitation  SUBJECTIVE: "Going really well"  PAIN:  Are you having pain? No  OBJECTIVE:   TODAY'S  TREATMENT: 03-29-22: Pt continues with excellent progress and successful carryover of SLP recommendations for recall, attention, and word retrieval. Rare word finding reported outside therapy with dysnomia x1 exhibited in conversation today, in which pt able to ID targeted word with extra processing time. Pt continues to use external aids for organization and recall as well as strategies for energy conservation and management of stimulation. Reading has reportedly improved with less repetition required. Pt demonstrates improvements in awareness and recognition of external distractions and implements modifications and/or cognitive breaks when increasing s/sx of headaches/fatigue noted. Pt denied any overt challenges or concerns at this time. Pt is pleased with current progress and agreeable to ST discharge this date.   03-22-22: Kiley continues to carryover taking breaks, reducing distractions and delegating/prioritizing tasks to compensate for memory, attention, processing. She is using strategies of getting information in writing and verifying verbal instructions as work. She successfully completed a big project at work and planned a grad party for her son. 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 Today, we targeted carrying over the above strategies and generated strategies of stopping every few pages and mentally reviewing what she has read to improve retention and attention, as well as mentally review what she read the past day, prior to beginning to read a new section. Re-administered the Cognitive Function PROM and she improved to a score of 102. Sadae reports word finding improved with using a pause to find the correct word and reports rare word  finding difficulties.   03-15-22: Discussed recent personal and work events with no overt anomia noted in conversation today. Dysnomia x1 exhibited, in which pt independently self-corrected. Success with work presentations indicated with  positive feedback received. Pt implementing cognitive breaks and recommended external aids as needed to support recall and overall cognitive functioning. SLP educated patient on impact of external factors of cognition and communication, including fatigue, stress, emotion, and stimulation. Pt able to identify appropriate solutions with rare min A with good anticipatory awareness exhibited.   03-08-22: Pt able to recall recommended strategies x3 from last ST session with mild extra processing time. Recommended use of audio recording to aid recall when surplus of information present. In extended conversation, pt presented with anomia x3, in which pt able to self-correct 2/3 with minimal extra time. Targeted use of word retrieval strategies, such as synonyms. Pt able to ID appropriate high level synonyms with ~95% accuracy with minimal extra time. Updated HEP for high level naming. Generated additional strategies to reduce cognitive fatigue.   03-01-22: Initiated training in environmental compensations to maximize processing and word finding during conversation. Generated reduce background noise, limit conversation partners to 1-2 people, use face to face communication, asking for information in writing, and avoid multitasking during conversation. Word finding episodes 2x today, which she ID'd and self corrected. Generated strategy of taking breaks in quiet space for 10-15 minutes before headache worsens to allow for reduced stimulation. See pt instructions. Complex word finding task completed wit 95% accuracy and mod I.    GOALS: Goals reviewed with patient? Yes   SHORT TERM GOALS: Target date: 03/17/2022    Pt will utilize word retrieval strategies to optimize verbal fluency and message cohesion in 20+ minute conversation given rare min A Baseline: 03-15-22 Goal status: ACHIEVED   2.  Pt will implement attention and processing strategies while reading multi-page material to aid recall and attention with 2  or less repetitions given rare min A   Baseline: 03-15-22 Goal status: NOT MET   3.  Pt will carryover memory compensations to aid recall of detailed information for work given rare min A  Baseline: 03-15-22 Goal status: ACHIEVED   4.  Pt will demonstrate increased immediate awareness of cognitive linguistic errors to self-correct given rare min A Baseline: 03-15-22 Goal status: ACHIEVED     LONG TERM GOALS: Target date: 04/14/2022     Pt will carryover memory and attention strategies to maximize efficiency for recall of pertinent work information and written information with mod I Baseline:  Goal status: ACHIEVED   2.  Pt will utilize word retrieval strategies to optimize verbal fluency and message cohesion in complex 30+ minute conversation or presentation with mod I Baseline:  Goal status: ACHIEVED   3.  Pt will report improved cognitive linguistic functioning via PROMs by 2 points at last ST session  Baseline: CF=79 & CPIB=16 Goal status: ACHIEVED   ASSESSMENT:   CLINICAL IMPRESSION: Patient is a 46 y.o. female who was seen for word retrieval difficulty and cognitive linguistic changes s/p concussion in March 2023. Significant improvements in prior cognitive linguistic challenges reported with use of strategies and compensations educated and trained by SLP team. Completed education and training of recommended cognitive and communication strategies to maximize current functioning. Pt is pleased with current level of functioning and agreeable to ST discharge today.    OBJECTIVE IMPAIRMENTS include attention, memory, awareness, and expressive language. These impairments are limiting patient from effectively communicating at home and in community and  successfully managing work/household tasks. No overt factors noted to affect potential to achieve goals and functional outcomes. Patient will benefit from skilled SLP services to address above impairments and improve overall function.    REHAB POTENTIAL: Excellent   PLAN: SLP FREQUENCY: 1x/week   SLP DURATION: 8 weeks   PLANNED INTERVENTIONS: Language facilitation, Cognitive reorganization, Internal/external aids, Oral motor exercises, Multimodal communication approach, SLP instruction and feedback, Compensatory strategies, and Patient/family education   Marzetta Board, CCC-SLP 03/29/2022, 12:36 PM
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
## 2022-04-10 ENCOUNTER — Encounter: Payer: Self-pay | Admitting: Family Medicine

## 2022-04-10 ENCOUNTER — Ambulatory Visit: Payer: BC Managed Care – PPO | Admitting: Family Medicine

## 2022-04-10 VITALS — BP 120/84 | HR 90 | Ht 65.0 in | Wt 187.0 lb

## 2022-04-10 DIAGNOSIS — S060X0D Concussion without loss of consciousness, subsequent encounter: Secondary | ICD-10-CM

## 2022-04-10 NOTE — Patient Instructions (Addendum)
Good to see you today.  Follow-up: as needed.   Frequent headaches are not good. That is a reason to restart the medicine.   If you have a question let me know. MyChart is a great way to talk to me.

## 2023-05-30 ENCOUNTER — Ambulatory Visit (INDEPENDENT_AMBULATORY_CARE_PROVIDER_SITE_OTHER): Payer: BC Managed Care – PPO

## 2023-05-30 ENCOUNTER — Ambulatory Visit: Payer: BC Managed Care – PPO | Admitting: Podiatry

## 2023-05-30 ENCOUNTER — Encounter: Payer: Self-pay | Admitting: Podiatry

## 2023-05-30 DIAGNOSIS — M7751 Other enthesopathy of right foot: Secondary | ICD-10-CM

## 2023-05-30 DIAGNOSIS — M778 Other enthesopathies, not elsewhere classified: Secondary | ICD-10-CM

## 2023-05-30 MED ORDER — MELOXICAM 15 MG PO TABS: 15.0000 mg | ORAL_TABLET | Freq: Every day | ORAL | 1 refills | Status: AC

## 2023-05-30 MED ORDER — METHYLPREDNISOLONE 4 MG PO TBPK: ORAL_TABLET | ORAL | 0 refills | Status: AC

## 2023-05-30 MED ORDER — BETAMETHASONE SOD PHOS & ACET 6 (3-3) MG/ML IJ SUSP
3.0000 mg | Freq: Once | INTRAMUSCULAR | Status: AC
  Administered 2023-05-30: 3 mg via INTRA_ARTICULAR

## 2023-05-30 NOTE — Progress Notes (Signed)
   Chief Complaint  Patient presents with   Foot Pain    Patient is here for pain in toes and ball of foot (right)    HPI: 47 y.o. female presenting today as a new patient for evaluation of pain and tenderness associated to the second toe of the right foot.  Onset about 2 months ago.  Idiopathic.  She denies any history of injury.  She has been icing her foot and massaging the area.  Past Medical History:  Diagnosis Date   Anxiety    Panic attacks     Past Surgical History:  Procedure Laterality Date   CERVICAL CONE BIOPSY  2004   CESAREAN SECTION  2008    Allergies  Allergen Reactions   Darvon [Propoxyphene] Itching and Rash   Percocet [Oxycodone-Acetaminophen] Itching and Rash     Physical Exam: General: The patient is alert and oriented x3 in no acute distress.  Dermatology: Skin is warm, dry and supple bilateral lower extremities.   Vascular: Palpable pedal pulses bilaterally. Capillary refill within normal limits.  No appreciable edema.  No erythema.  Neurological: Grossly intact via light touch  Musculoskeletal Exam: Mild hammertoe contracture deformity noted to the second digit of the right foot.  There is associated tenderness with palpation and range of motion of the second MTP of the right foot  Radiographic Exam RT foot 05/30/2023:  Normal osseous mineralization. Joint spaces preserved.  No fractures or osseous irregularities noted.  Assessment/Plan of Care: 1.  Second MTP capsulitis with mild hammertoe deformity right second digit  -Patient evaluated.  X-rays reviewed -Injection of 0.5 cc Celestone Soluspan injected in the second DP of the right foot -Prescription for a Medrol Dosepak -Prescription for meloxicam 15 mg daily after completion of the Dosepak -Advise against going barefoot.  Recommend either rocker-bottom type shoes or stiff sole shoes that do not allow much flexibility of into the second MTP -Offloading felt metatarsal pads were applied to the  insoles and provided for the patient to offload pressure from the second MTP -Return to clinic 4 weeks  *[facility].  Works with Inda Castle, DPM [facility]  Dr. Felecia Shelling, DPM    [address] [place], [place] [address]                Office [phone]  Fax [phone]

## 2023-06-20 IMAGING — MG MM DIGITAL DIAGNOSTIC UNILAT*R* W/ TOMO W/ CAD
6 series · 6 of 18 positions shown · non-contrast
Comparison: Previous exam(s).

CLINICAL DATA: 45-year-old female with palpable thickening in the
OUTER RIGHT breast discovered on self-examination.

EXAM:
DIGITAL DIAGNOSTIC UNILATERAL RIGHT MAMMOGRAM WITH TOMOSYNTHESIS AND
CAD; ULTRASOUND RIGHT BREAST LIMITED
TECHNIQUE: Right digital diagnostic mammography and breast tomosynthesis was
performed. The images were evaluated with computer-aided detection.;
Targeted ultrasound examination of the right breast was performed

[R MLO synth-2D]
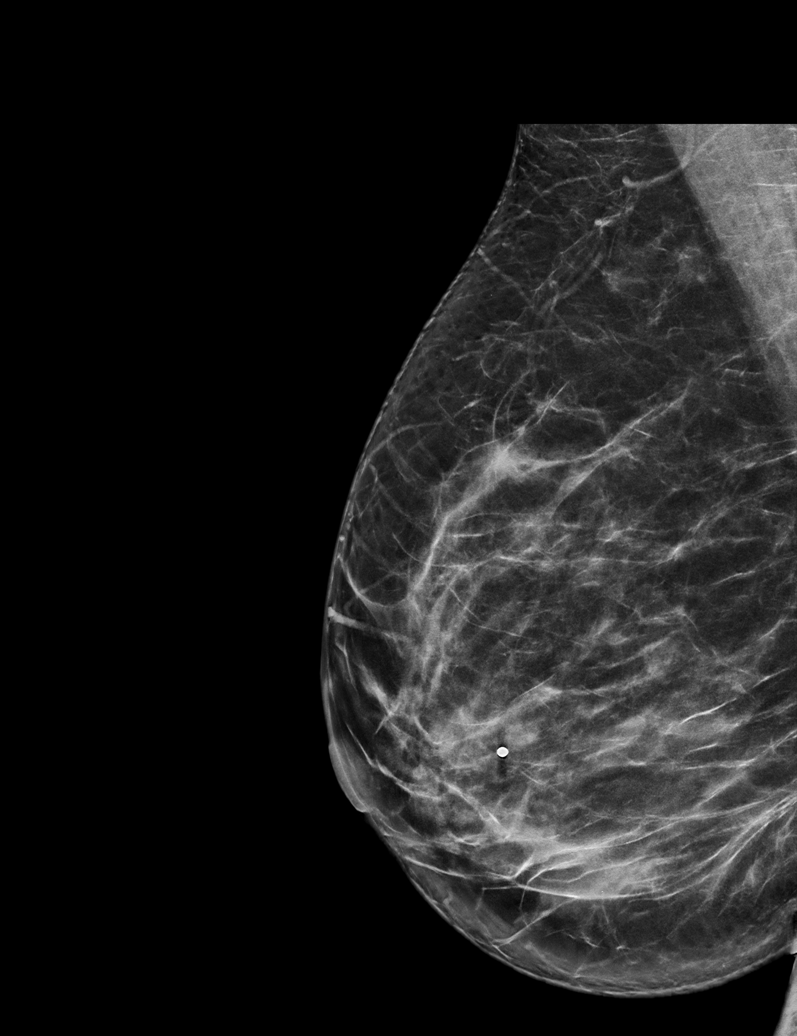

[R CC synth-2D]
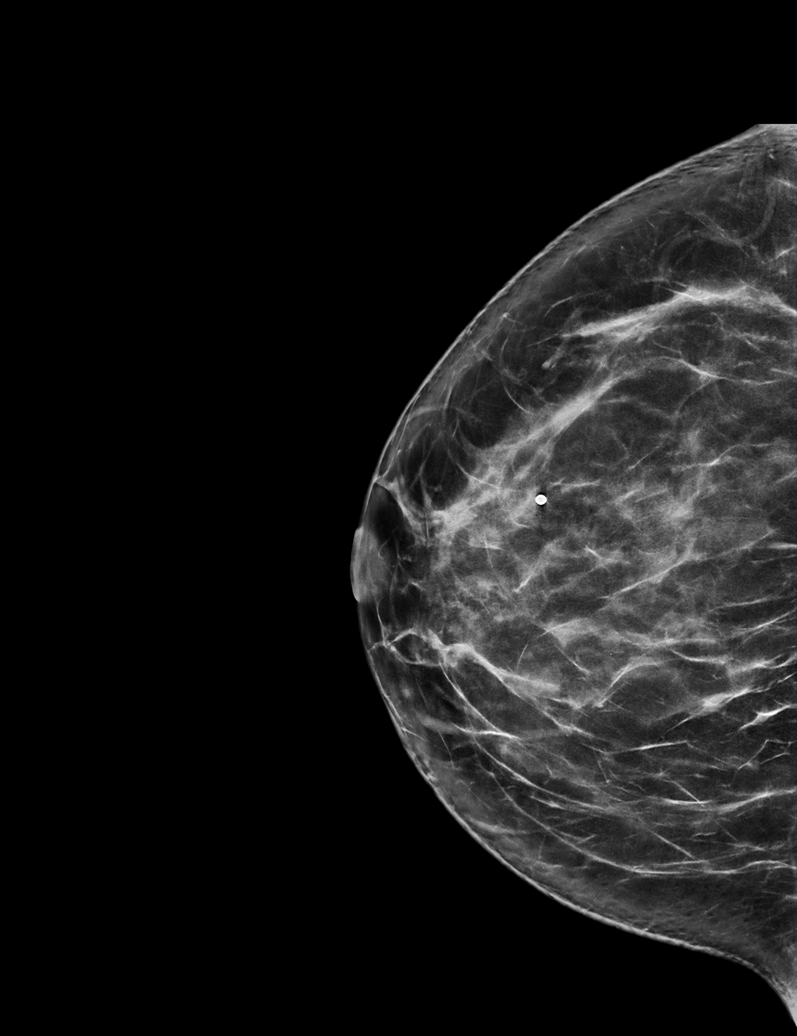

[R TAN synth-2D]
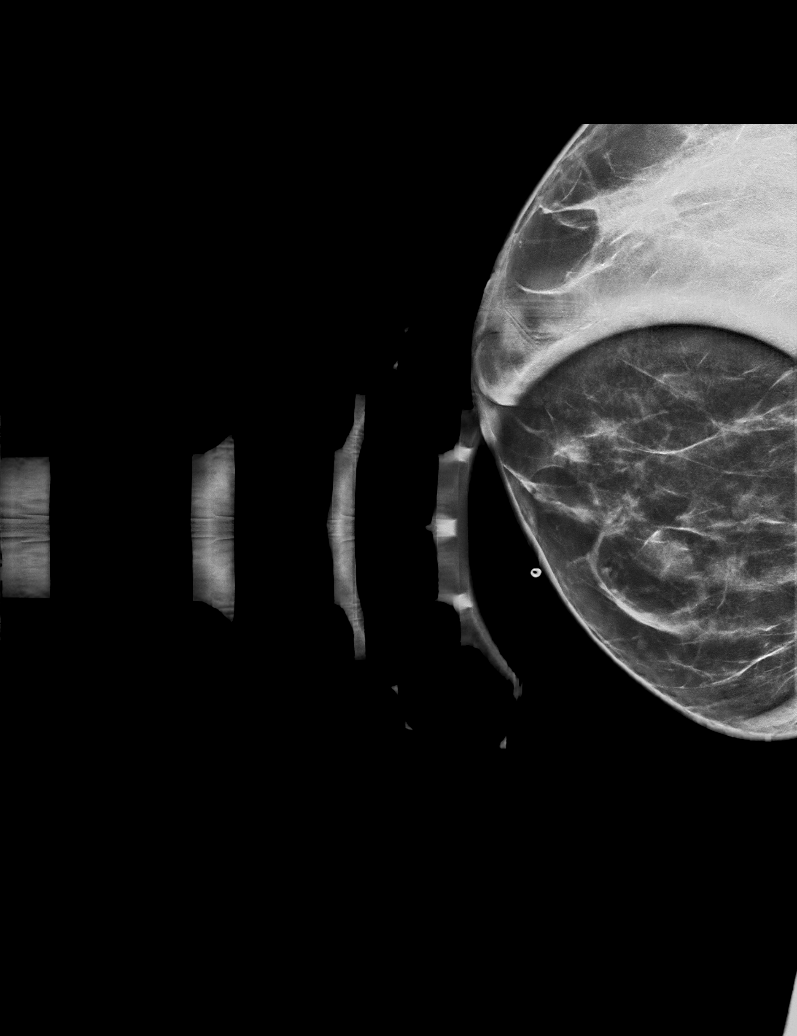

[R MLO tomo · tomo slice 37/73.0]
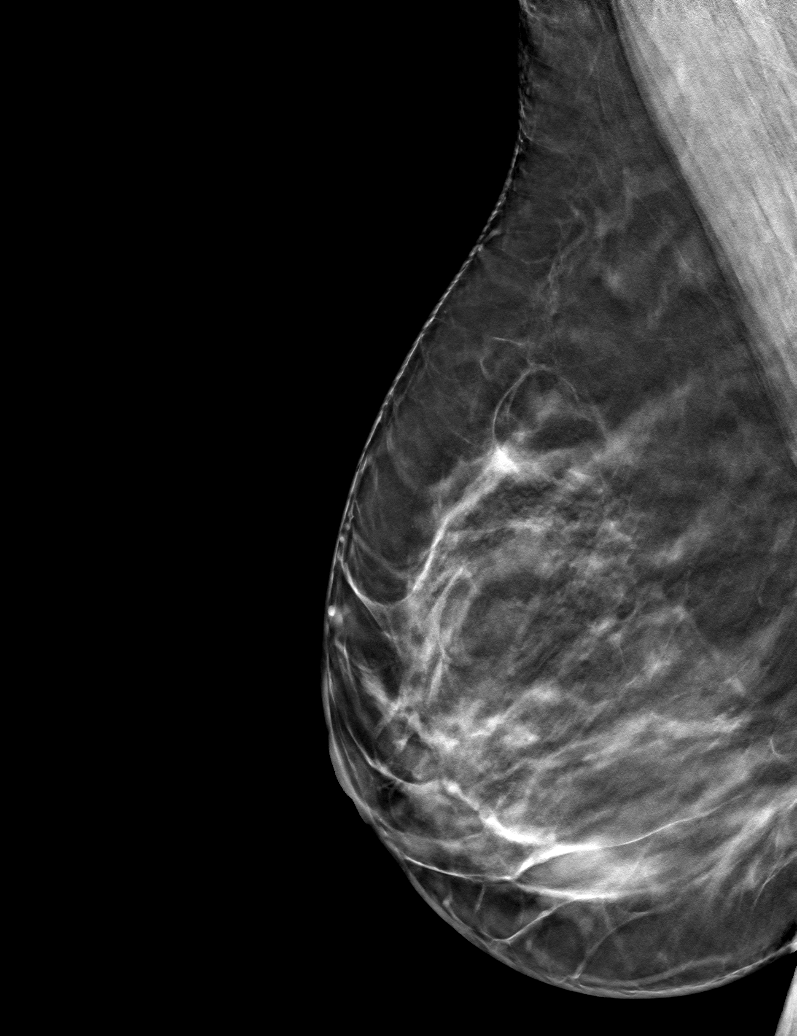

[R TAN tomo · tomo slice 31/62.0]
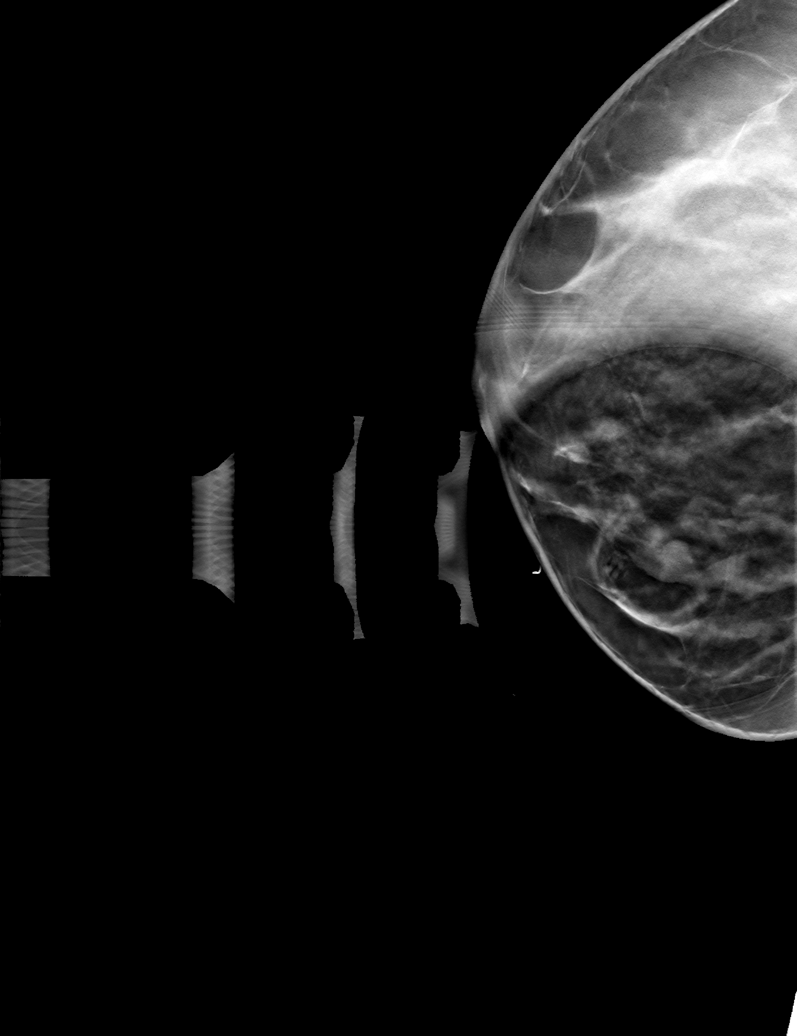

[R CC tomo · tomo slice 37/74.0]
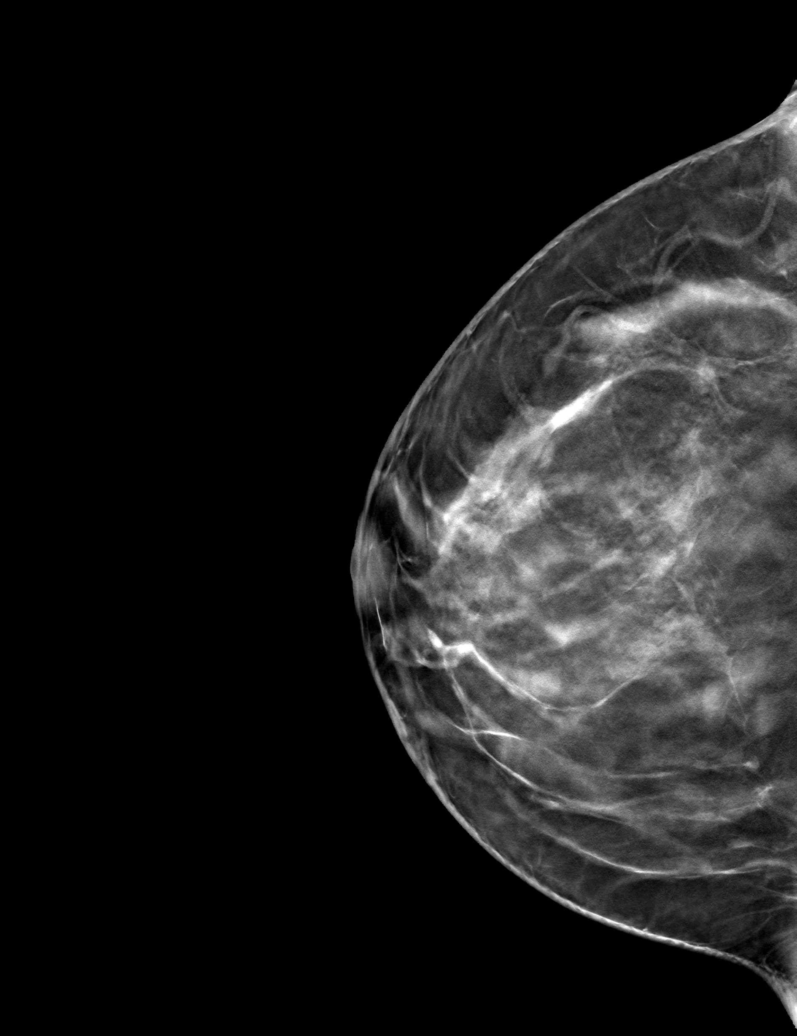

[6 of 18 positions shown; findings below may reference images not displayed]

ACR Breast Density Category c: The breast tissue is heterogeneously
dense, which may obscure small masses.
FINDINGS: Full field and spot compression views of the RIGHT breast
demonstrate no suspicious mass, distortion or worrisome
calcifications.

On physical exam, no palpable abnormalities are identified within
the OUTER RIGHT breast, in the area of patient concern.

Targeted ultrasound is performed, showing no sonographic
abnormalities are identified within the OUTER RIGHT breast, in the
area of patient concern.
IMPRESSION: 1. No mammographic, palpable or sonographic abnormality within the
RIGHT breast, in the area of patient concern.
2. No mammographic evidence of RIGHT breast malignancy.

RECOMMENDATION:
Consider clinical follow-up as indicated. Any further workup should
be based on clinical grounds.

Bilateral screening mammogram in July 2022 to resume annual
mammogram schedule.

I have discussed the findings and recommendations with the patient.
If applicable, a reminder letter will be sent to the patient
regarding the next appointment.

BI-RADS CATEGORY  1: Negative.

## 2023-06-25 ENCOUNTER — Ambulatory Visit: Payer: BC Managed Care – PPO | Admitting: Podiatry

## 2023-06-27 ENCOUNTER — Encounter: Payer: Self-pay | Admitting: Podiatry

## 2023-06-27 ENCOUNTER — Ambulatory Visit: Payer: BC Managed Care – PPO | Admitting: Podiatry

## 2023-06-27 VITALS — Ht 65.0 in | Wt 187.0 lb

## 2023-06-27 DIAGNOSIS — M7751 Other enthesopathy of right foot: Secondary | ICD-10-CM | POA: Diagnosis not present

## 2023-06-27 NOTE — Progress Notes (Signed)
   Chief Complaint  Patient presents with   Foot Pain    Patient is here for F/U right foot has been starting to hurt on and off over the last week not hurting today though    HPI: 47 y.o. female presenting today for follow-up evaluation of pain and tenderness associated to the second toe of the right foot.  Overall the patient states that she is doing significantly better.  She says that the injection helped significantly as well as anti-inflammatory.  Past Medical History:  Diagnosis Date   Anxiety    Panic attacks     Past Surgical History:  Procedure Laterality Date   CERVICAL CONE BIOPSY  2004   CESAREAN SECTION  2008    Allergies  Allergen Reactions   Darvon [Propoxyphene] Itching and Rash   Percocet [Oxycodone-Acetaminophen] Itching and Rash     Physical Exam: General: The patient is alert and oriented x3 in no acute distress.  Dermatology: Skin is warm, dry and supple bilateral lower extremities.   Vascular: Palpable pedal pulses bilaterally. Capillary refill within normal limits.  No appreciable edema.  No erythema.  Neurological: Grossly intact via light touch  Musculoskeletal Exam: Mild hammertoe contracture deformity noted to the second digit of the right foot.  Today there is no tenderness with palpation or range of motion of the second MTP Radiographic Exam RT foot 05/30/2023:  Normal osseous mineralization. Joint spaces preserved.  No fractures or osseous irregularities noted.  Assessment/Plan of Care: 1.  Second MTP capsulitis with mild hammertoe deformity right second digit  -Patient evaluated.   - Continue meloxicam 15 mg daily as needed - Continue to refrain from going barefoot.  Recommend either rocker-bottom type shoes or stiff sole shoes that do not allow much flexibility of into the second MTP -Offloading felt metatarsal pads were applied to the insoles and provided for the patient to offload pressure from the second MTP - Return to clinic as  needed     Felecia Shelling, DPM [facility]  Dr. Felecia Shelling, DPM    [address] [place], [place] [address]                Office [phone]  Fax [phone]
